# Patient Record
Sex: Male | Born: 1955 | Race: White | Hispanic: No | Marital: Married | Smoking: Never smoker
Health system: Southern US, Community
[De-identification: ages and names within clinical notes are randomized; demographics above are authoritative.]

## PROBLEM LIST (undated history)

## (undated) DIAGNOSIS — I472 Ventricular tachycardia: Secondary | ICD-10-CM

## (undated) DIAGNOSIS — E039 Hypothyroidism, unspecified: Secondary | ICD-10-CM

## (undated) DIAGNOSIS — I491 Atrial premature depolarization: Secondary | ICD-10-CM

## (undated) DIAGNOSIS — Z9289 Personal history of other medical treatment: Secondary | ICD-10-CM

## (undated) DIAGNOSIS — I471 Supraventricular tachycardia: Secondary | ICD-10-CM

## (undated) DIAGNOSIS — N419 Inflammatory disease of prostate, unspecified: Secondary | ICD-10-CM

## (undated) DIAGNOSIS — I498 Other specified cardiac arrhythmias: Secondary | ICD-10-CM

## (undated) DIAGNOSIS — I499 Cardiac arrhythmia, unspecified: Secondary | ICD-10-CM

## (undated) DIAGNOSIS — E785 Hyperlipidemia, unspecified: Secondary | ICD-10-CM

## (undated) DIAGNOSIS — I4729 Other ventricular tachycardia: Secondary | ICD-10-CM

## (undated) DIAGNOSIS — I493 Ventricular premature depolarization: Secondary | ICD-10-CM

## (undated) DIAGNOSIS — R9431 Abnormal electrocardiogram [ECG] [EKG]: Secondary | ICD-10-CM

## (undated) DIAGNOSIS — I4719 Other supraventricular tachycardia: Secondary | ICD-10-CM

## (undated) HISTORY — PX: APPENDECTOMY: SHX54

## (undated) HISTORY — DX: Other ventricular tachycardia: I47.29

## (undated) HISTORY — DX: Other specified cardiac arrhythmias: I49.8

## (undated) HISTORY — DX: Inflammatory disease of prostate, unspecified: N41.9

## (undated) HISTORY — DX: Cardiac arrhythmia, unspecified: I49.9

## (undated) HISTORY — DX: Personal history of other medical treatment: Z92.89

## (undated) HISTORY — DX: Supraventricular tachycardia: I47.1

## (undated) HISTORY — DX: Ventricular premature depolarization: I49.3

## (undated) HISTORY — DX: Ventricular tachycardia: I47.2

## (undated) HISTORY — DX: Other supraventricular tachycardia: I47.19

## (undated) HISTORY — DX: Abnormal electrocardiogram (ECG) (EKG): R94.31

## (undated) HISTORY — DX: Hypothyroidism, unspecified: E03.9

## (undated) HISTORY — DX: Hyperlipidemia, unspecified: E78.5

## (undated) HISTORY — DX: Atrial premature depolarization: I49.1

---

## 2004-02-01 ENCOUNTER — Ambulatory Visit: Payer: Self-pay | Admitting: Internal Medicine

## 2004-02-09 ENCOUNTER — Ambulatory Visit: Payer: Self-pay | Admitting: Internal Medicine

## 2004-02-27 HISTORY — PX: BASAL CELL CARCINOMA EXCISION: SHX1214

## 2005-02-12 ENCOUNTER — Ambulatory Visit: Payer: Self-pay | Admitting: Internal Medicine

## 2006-02-01 ENCOUNTER — Ambulatory Visit: Payer: Self-pay | Admitting: Internal Medicine

## 2006-02-01 LAB — CONVERTED CEMR LAB
ALT: 23 units/L (ref 0–40)
AST: 24 units/L (ref 0–37)
Alkaline Phosphatase: 55 units/L (ref 39–117)
BUN: 9 mg/dL (ref 6–23)
Basophils Relative: 1.2 % — ABNORMAL HIGH (ref 0.0–1.0)
Calcium: 9.5 mg/dL (ref 8.4–10.5)
Chol/HDL Ratio, serum: 3.8
Cholesterol: 216 mg/dL (ref 0–200)
Eosinophil percent: 7.5 % — ABNORMAL HIGH (ref 0.0–5.0)
GFR calc non Af Amer: 84 mL/min
Glomerular Filtration Rate, Af Am: 102 mL/min/{1.73_m2}
Glucose, Bld: 89 mg/dL (ref 70–99)
HDL: 57.2 mg/dL (ref >39.0)
Hgb A1c MFr Bld: 5.5 % (ref 4.6–6.0)
Monocytes Relative: 9.8 % (ref 3.0–11.0)
Platelets: 273 10*3/uL (ref 150–400)
Potassium: 4.3 meq/L (ref 3.5–5.1)
RDW: 12.2 % (ref 11.5–14.6)
TSH: 3.38 microintl units/mL (ref 0.35–5.50)
Total Protein: 7.2 g/dL (ref 6.0–8.3)
VLDL: 19 mg/dL (ref 0–40)
WBC: 3.3 10*3/uL — ABNORMAL LOW (ref 4.5–10.5)

## 2006-04-05 ENCOUNTER — Ambulatory Visit: Payer: Self-pay

## 2006-05-14 ENCOUNTER — Ambulatory Visit: Payer: Self-pay | Admitting: Internal Medicine

## 2006-05-14 LAB — CONVERTED CEMR LAB
ALT: 19 units/L (ref 0–40)
HDL: 53.3 mg/dL (ref >39.0)
Triglycerides: 71 mg/dL (ref 0–149)

## 2006-05-16 ENCOUNTER — Ambulatory Visit: Payer: Self-pay | Admitting: Internal Medicine

## 2006-06-04 ENCOUNTER — Ambulatory Visit: Payer: Self-pay

## 2006-06-13 ENCOUNTER — Ambulatory Visit: Payer: Self-pay | Admitting: Internal Medicine

## 2006-06-26 ENCOUNTER — Ambulatory Visit: Payer: Self-pay | Admitting: Cardiovascular Disease

## 2006-07-12 ENCOUNTER — Ambulatory Visit: Payer: Self-pay | Admitting: Internal Medicine

## 2006-07-16 ENCOUNTER — Encounter: Payer: Self-pay | Admitting: Cardiovascular Disease

## 2006-07-16 ENCOUNTER — Ambulatory Visit: Payer: Self-pay

## 2007-02-24 ENCOUNTER — Telehealth (INDEPENDENT_AMBULATORY_CARE_PROVIDER_SITE_OTHER): Payer: Self-pay | Admitting: *Deleted

## 2007-02-27 HISTORY — PX: COLONOSCOPY: SHX174

## 2007-03-27 ENCOUNTER — Telehealth: Payer: Self-pay | Admitting: Internal Medicine

## 2007-04-04 ENCOUNTER — Telehealth (INDEPENDENT_AMBULATORY_CARE_PROVIDER_SITE_OTHER): Payer: Self-pay | Admitting: *Deleted

## 2007-04-30 ENCOUNTER — Ambulatory Visit: Payer: Self-pay | Admitting: Internal Medicine

## 2007-05-19 ENCOUNTER — Ambulatory Visit: Payer: Self-pay | Admitting: Internal Medicine

## 2007-05-19 DIAGNOSIS — E785 Hyperlipidemia, unspecified: Secondary | ICD-10-CM | POA: Insufficient documentation

## 2007-05-19 DIAGNOSIS — R002 Palpitations: Secondary | ICD-10-CM | POA: Insufficient documentation

## 2007-05-21 LAB — CONVERTED CEMR LAB
ALT: 19 units/L (ref 0–53)
Albumin: 4.1 g/dL (ref 3.5–5.2)
Alkaline Phosphatase: 45 units/L (ref 39–117)
BUN: 15 mg/dL (ref 6–23)
Basophils Absolute: 0 10*3/uL (ref 0.0–0.1)
Basophils Relative: 1.5 % — ABNORMAL HIGH (ref 0.0–1.0)
CO2: 32 meq/L (ref 19–32)
Calcium: 9.2 mg/dL (ref 8.4–10.5)
Cholesterol: 165 mg/dL (ref 0–200)
GFR calc Af Amer: 115 mL/min
GFR calc non Af Amer: 95 mL/min
HDL: 66.3 mg/dL (ref >39.0)
Hemoglobin: 13 g/dL (ref 13.0–17.0)
LDL Cholesterol: 80 mg/dL (ref 0–99)
Lymphocytes Relative: 28 % (ref 12.0–46.0)
MCHC: 33.1 g/dL (ref 30.0–36.0)
Monocytes Absolute: 0.3 10*3/uL (ref 0.2–0.7)
Monocytes Relative: 10.3 % (ref 3.0–11.0)
Neutro Abs: 1.7 10*3/uL (ref 1.4–7.7)
PSA: 0.32 ng/mL (ref 0.10–4.00)
Platelets: 223 10*3/uL (ref 150–400)
Potassium: 4.2 meq/L (ref 3.5–5.1)
TSH: 3.67 microintl units/mL (ref 0.35–5.50)
Triglycerides: 92 mg/dL (ref 0–149)
VLDL: 18 mg/dL (ref 0–40)

## 2007-05-22 ENCOUNTER — Encounter (INDEPENDENT_AMBULATORY_CARE_PROVIDER_SITE_OTHER): Payer: Self-pay | Admitting: *Deleted

## 2007-06-04 ENCOUNTER — Ambulatory Visit: Payer: Self-pay | Admitting: Gastroenterology

## 2007-06-16 ENCOUNTER — Ambulatory Visit: Payer: Self-pay | Admitting: Gastroenterology

## 2007-06-16 ENCOUNTER — Encounter: Payer: Self-pay | Admitting: Internal Medicine

## 2007-06-20 ENCOUNTER — Ambulatory Visit: Payer: Self-pay | Admitting: Internal Medicine

## 2007-08-12 ENCOUNTER — Ambulatory Visit: Payer: Self-pay

## 2007-09-09 ENCOUNTER — Ambulatory Visit: Payer: Self-pay

## 2007-09-09 ENCOUNTER — Encounter: Payer: Self-pay | Admitting: Internal Medicine

## 2007-09-22 ENCOUNTER — Telehealth: Payer: Self-pay | Admitting: Internal Medicine

## 2007-10-13 ENCOUNTER — Ambulatory Visit: Payer: Self-pay | Admitting: Internal Medicine

## 2007-10-13 LAB — CONVERTED CEMR LAB
BUN: 17 mg/dL (ref 6–23)
Basophils Relative: 0.7 % (ref 0.0–3.0)
CO2: 30 meq/L (ref 19–32)
Chloride: 104 meq/L (ref 96–112)
Eosinophils Absolute: 0.1 10*3/uL (ref 0.0–0.7)
Eosinophils Relative: 2.3 % (ref 0.0–5.0)
GFR calc non Af Amer: 84 mL/min
INR: 1 (ref 0.8–1.0)
Lymphocytes Relative: 21.6 % (ref 12.0–46.0)
MCV: 98 fL (ref 78.0–100.0)
Neutrophils Relative %: 64.6 % (ref 43.0–77.0)
Platelets: 204 10*3/uL (ref 150–400)
Potassium: 4.4 meq/L (ref 3.5–5.1)
Prothrombin Time: 12.2 s (ref 10.9–13.3)
RBC: 3.89 M/uL — ABNORMAL LOW (ref 4.22–5.81)
WBC: 4.1 10*3/uL — ABNORMAL LOW (ref 4.5–10.5)
aPTT: 30.4 s — ABNORMAL HIGH (ref 21.7–29.8)

## 2007-10-20 ENCOUNTER — Ambulatory Visit: Payer: Self-pay | Admitting: Cardiology

## 2007-10-20 ENCOUNTER — Inpatient Hospital Stay (HOSPITAL_BASED_OUTPATIENT_CLINIC_OR_DEPARTMENT_OTHER): Admission: RE | Admit: 2007-10-20 | Discharge: 2007-10-20 | Payer: Self-pay | Admitting: Cardiology

## 2007-12-11 ENCOUNTER — Ambulatory Visit: Payer: Self-pay | Admitting: Internal Medicine

## 2008-02-27 DIAGNOSIS — I499 Cardiac arrhythmia, unspecified: Secondary | ICD-10-CM

## 2008-02-27 HISTORY — DX: Cardiac arrhythmia, unspecified: I49.9

## 2008-02-27 HISTORY — PX: CARDIAC CATHETERIZATION: SHX172

## 2008-03-10 ENCOUNTER — Ambulatory Visit: Payer: Self-pay | Admitting: Internal Medicine

## 2008-08-06 ENCOUNTER — Ambulatory Visit: Payer: Self-pay | Admitting: Internal Medicine

## 2008-08-06 DIAGNOSIS — Z85828 Personal history of other malignant neoplasm of skin: Secondary | ICD-10-CM | POA: Insufficient documentation

## 2008-08-06 LAB — CONVERTED CEMR LAB
HDL goal, serum: 40 mg/dL
LDL Goal: 115 mg/dL

## 2008-08-08 LAB — CONVERTED CEMR LAB
Albumin: 4.1 g/dL (ref 3.5–5.2)
BUN: 16 mg/dL (ref 6–23)
Basophils Absolute: 0 10*3/uL (ref 0.0–0.1)
CO2: 32 meq/L (ref 19–32)
Cholesterol: 176 mg/dL (ref 0–200)
Eosinophils Absolute: 0.3 10*3/uL (ref 0.0–0.7)
Glucose, Bld: 99 mg/dL (ref 70–99)
HCT: 39.8 % (ref 39.0–52.0)
HDL: 69.2 mg/dL (ref >39.00)
Hemoglobin: 13.8 g/dL (ref 13.0–17.0)
Lymphs Abs: 1 10*3/uL (ref 0.7–4.0)
MCHC: 34.7 g/dL (ref 30.0–36.0)
Monocytes Absolute: 0.3 10*3/uL (ref 0.1–1.0)
Neutro Abs: 2.8 10*3/uL (ref 1.4–7.7)
PSA: 0.79 ng/mL (ref 0.10–4.00)
Potassium: 4.3 meq/L (ref 3.5–5.1)
RDW: 12.2 % (ref 11.5–14.6)
Sodium: 140 meq/L (ref 135–145)
TSH: 3.88 microintl units/mL (ref 0.35–5.50)

## 2008-08-09 ENCOUNTER — Encounter (INDEPENDENT_AMBULATORY_CARE_PROVIDER_SITE_OTHER): Payer: Self-pay | Admitting: *Deleted

## 2009-06-08 ENCOUNTER — Ambulatory Visit: Payer: Self-pay | Admitting: Internal Medicine

## 2009-06-13 LAB — CONVERTED CEMR LAB
ALT: 23 units/L (ref 0–53)
AST: 29 units/L (ref 0–37)
Albumin: 4.3 g/dL (ref 3.5–5.2)
Alkaline Phosphatase: 58 units/L (ref 39–117)
Basophils Absolute: 0 10*3/uL (ref 0.0–0.1)
CO2: 32 meq/L (ref 19–32)
Calcium: 9.2 mg/dL (ref 8.4–10.5)
Cholesterol: 179 mg/dL (ref 0–200)
GFR calc non Af Amer: 94.04 mL/min (ref >60)
HCT: 40.1 % (ref 39.0–52.0)
Lymphs Abs: 1 10*3/uL (ref 0.7–4.0)
Monocytes Absolute: 0.3 10*3/uL (ref 0.1–1.0)
Monocytes Relative: 8.2 % (ref 3.0–12.0)
PSA: 0.36 ng/mL (ref 0.10–4.00)
Platelets: 235 10*3/uL (ref 150.0–400.0)
RDW: 13.1 % (ref 11.5–14.6)
Sodium: 142 meq/L (ref 135–145)
Total CHOL/HDL Ratio: 3
Total Protein: 7.3 g/dL (ref 6.0–8.3)
Triglycerides: 69 mg/dL (ref 0.0–149.0)

## 2009-06-14 ENCOUNTER — Ambulatory Visit: Payer: Self-pay | Admitting: Internal Medicine

## 2009-06-14 DIAGNOSIS — R946 Abnormal results of thyroid function studies: Secondary | ICD-10-CM | POA: Insufficient documentation

## 2009-08-22 ENCOUNTER — Ambulatory Visit: Payer: Self-pay | Admitting: Internal Medicine

## 2009-08-23 LAB — CONVERTED CEMR LAB: TSH: 4.55 microintl units/mL (ref 0.35–5.50)

## 2009-10-28 ENCOUNTER — Telehealth (INDEPENDENT_AMBULATORY_CARE_PROVIDER_SITE_OTHER): Payer: Self-pay | Admitting: *Deleted

## 2010-01-17 ENCOUNTER — Encounter: Payer: Self-pay | Admitting: Internal Medicine

## 2010-03-28 NOTE — Assessment & Plan Note (Signed)
Summary: CPX,LABS PRIOR,BCBS INS/RH.....   Vital Signs:  Patient profile:   55 year old male Height:      73 inches Weight:      182.2 pounds BMI:     24.13 Temp:     98.4 degrees F oral Pulse rate:   60 / minute Resp:     14 per minute BP sitting:   112 / 68  (left arm) Cuff size:   large  Vitals Entered By: Shonna Chock (June 14, 2009 9:45 AM)  Comments REVIEWED MED LIST, PATIENT AGREED DOSE AND INSTRUCTION CORRECT    History of Present Illness: Joshua [name] is here for a physical; he is asymptomatic except for occasional palpitations for which he is followed by Dr Graciela Husbands.Labs reviewed : TSH 5.90 ,suggesting hypothyroidism.  Allergies (verified): No Known Drug Allergies  Past History:  Past Medical History: HYPERLIPIDEMIA (ICD-272.4): NMR Lipoprofile : LDL 841(3244/010), HDL 57, TG 86. LDL goal = < 115. Paroxysmal ,non sustained Atrial  Tachycardia, Dr Berton Mount Skin cancer, hx of, basal cell  cancer R nasal area, Dr Para Skeans Prostatitis , PMH of  Past Surgical History: Appendectomy Colonoscopy 2009: negative (due 2019); Catheterization 11/2007 for hypotension with Stress Test : negative Tonsillectomy  Family History: Father: alcoholism,CVA,CAD,CABG,DM,depression Mother: arthritis, breast cancer, thyroid ablation? Siblings: bro: PAF , failed ablation X3  Social History: Never Smoked Alcohol use-yes: socially 3-4X/week Retired (pursuing business opportunities: franchise, Designer, television/film set) Married Regular exercise-yes:CVE 3X/week for 45 min or more  Review of Systems General:  Denies fatigue, sleep disorder, and weight loss. Eyes:  Denies blurring, double vision, and vision loss-both eyes. ENT:  Denies decreased hearing, difficulty swallowing, hoarseness, and ringing in ears. CV:  Denies chest pain or discomfort, difficulty breathing at night, difficulty breathing while lying down, fainting, fatigue, leg cramps with exertion, lightheadness, near fainting,  shortness of breath with exertion, swelling of feet, and swelling of hands. Resp:  Denies cough, shortness of breath, and sputum productive. GI:  Denies abdominal pain, bloody stools, change in bowel habits, dark tarry stools, and indigestion. GU:  Denies discharge, dysuria, and hematuria. MS:  Denies joint pain, joint redness, joint swelling, low back pain, mid back pain, muscle weakness, and thoracic pain. Derm:  Denies changes in nail beds, dryness, lesion(s), and rash. Neuro:  Denies headaches, numbness, tingling, and weakness. Psych:  Denies anxiety and depression. Endo:  Denies cold intolerance, excessive hunger, excessive thirst, excessive urination, and heat intolerance. Heme:  Denies abnormal bruising and bleeding. Allergy:  Denies itching eyes and sneezing.  Physical Exam  General:  Thin but well-nourished; alert,appropriate and cooperative throughout examination Head:  Normocephalic and atraumatic without obvious abnormalities. Pattern  alopecia ; moustache & goatee Eyes:  No corneal or conjunctival inflammation noted.Perrla. Funduscopic exam benign, without hemorrhages, exudates or papilledema.Tigroid fundal changes Ears:  Wax R canal; oteomata L anterior canal Nose:  External nasal examination shows no deformity or inflammation. Nasal mucosa are pink and moist without lesions or exudates. Mouth:  Oral mucosa and oropharynx without lesions or exudates.  Teeth in good repair. Neck:  No deformities, masses, or tenderness noted. Lungs:  Normal respiratory effort, chest expands symmetrically. Lungs are clear to auscultation, no crackles or wheezes. Heart:  Normal rate and regular rhythm. S1 and S2 normal without gallop, murmur, click, rub .S4 Abdomen:  Bowel sounds positive,abdomen soft and non-tender without masses, organomegaly or hernias noted. Rectal:  No external abnormalities noted. Normal sphincter tone. No rectal masses or tenderness. Genitalia:  Testes bilaterally descended  without nodularity, tenderness or masses. No scrotal masses or lesions. No penis lesions or urethral discharge. Prostate:  Prostate gland firm and smooth, no enlargement, nodularity, tenderness, mass, asymmetry or induration. Msk:  No deformity or scoliosis noted of thoracic or lumbar spine.   Pulses:  R and L carotid,radial,dorsalis pedis and posterior tibial pulses are full and equal bilaterally Extremities:  No clubbing, cyanosis, edema, or deformity noted with normal full range of motion of all joints.   Neurologic:  alert & oriented X3 and DTRs symmetrical and normal.   Skin:  Intact without suspicious lesions or rashes Cervical Nodes:  No lymphadenopathy noted Axillary Nodes:  No palpable lymphadenopathy Inguinal Nodes:  No significant adenopathy Psych:  memory intact for recent and remote, normally interactive, and good eye contact.     Impression & Recommendations:  Problem # 1:  ROUTINE GENERAL MEDICAL EXAM@HEALTH  CARE FACL (ICD-V70.0)  Orders: EKG w/ Interpretation (93000)  Problem # 2:  HYPERLIPIDEMIA (ICD-272.4)  His updated medication list for this problem includes:    Pravastatin Sodium 40 Mg Tabs (Pravastatin sodium) .Marland Kitchen... 1 by mouth once daily  Problem # 3:  THYROID FUNCTION TEST, ABNORMAL (ICD-794.5) TSH 5.90  Problem # 4:  PALPITATIONS (ICD-785.1)  Non sustained Atrial Tachycardia by PMH (Dr Graciela Husbands)  Orders: EKG w/ Interpretation (93000)  Complete Medication List: 1)  Lorazepam 1 Mg Tabs (Lorazepam) .... 1/2 tab as needed 2)  Pravastatin Sodium 40 Mg Tabs (Pravastatin sodium) .Marland Kitchen.. 1 by mouth once daily 3)  St. John's Wort   Patient Instructions: 1)  Please schedule a follow-up  Lab appointment in 2 months. 2)  TSH ; free T4,free T3: 794.5. Prescriptions: PRAVASTATIN SODIUM 40 MG  TABS (PRAVASTATIN SODIUM) 1 by mouth once daily  #90 x 3   Entered and Authorized by:   Marga Melnick MD   Signed by:   Marga Melnick MD on 06/14/2009   Method used:   Print  then Give to Patient   RxID:   [id number]

## 2010-03-28 NOTE — Letter (Signed)
Summary: [facility]   Imported By: Lanelle Bal 01/27/2010 09:49:52  _____________________________________________________________________  External Attachment:    Type:   Image     Comment:   External Document

## 2010-03-28 NOTE — Progress Notes (Signed)
Summary: refill  Phone Note Refill Request Message from:  Fax from Pharmacy on October 28, 2009 9:08 AM  Refills Requested: Medication #1:  LORAZEPAM 1 MG  TABS 1/2 tab [facility] [address] [facility] fax [phone]  Initial call taken by: Okey Regal [place],  October 28, 2009 9:09 AM    Prescriptions: LORAZEPAM 1 MG  TABS (LORAZEPAM) 1/2 tab as needed  #30 x 1   Entered by:   Shonna Chock CMA   Authorized by:   Marga Melnick MD   Signed by:   Shonna Chock CMA on 10/28/2009   Method used:   Printed then faxed to ...       [facility]  [phone]* (retail)       [address] [place], [place]  [address]       Ph: [phone] or [phone]       Fax: [phone]   RxID:   [id number]

## 2010-07-11 NOTE — H&P (Signed)
Joshua [name], Joshua [name]              ACCOUNT NO.:  [id number]   MEDICAL RECORD NO.:  [id number]          PATIENT TYPE:  OIB   LOCATION:  1961                         FACILITY:  [facility]   PHYSICIAN:  Arturo Morton. Riley Kill, MD, FACCDATE OF BIRTH:  1955/12/30   DATE OF ADMISSION:  10/20/2007  DATE OF DISCHARGE:  10/20/2007                              HISTORY & PHYSICAL   CHIEF COMPLAINT:  Abnormal stress test.   HISTORY OF PRESENT ILLNESS:  Joshua [name] is a 55 year old gentleman seen  by Dr. Graciela Husbands and set up for outpatient cardiac catheterization.  The  patient has not had exertional chest discomfort.  He has had  palpitation, has been followed by Dr. Graciela Husbands and Dr. Eden Emms.  He had a 2-  D echocardiogram that was unremarkable.  His brother has had atrial  fibrillation on several occasions, and apparently had an ablation.  He  underwent an exercise treadmill study to see if there could be exercise  and postexercise associated tachy palpitations which is what he feels.  He did have an exercise treadmill that was terminated in stage V.  At  peak stress, the patient had a drop in blood pressure and then  afterwards dropped again.  As a result, they ended up doing a Myoview.  On the Myoview, he had scattered PVCs and a one 3-beat of VT.  The  ejection fraction was 54%.  There was normal contractility and no  definite perfusion defect.  His echocardiogram has been previously  normal.  Nonetheless, after discussion with Dr. Graciela Husbands, it was felt that  this would be best sorted out with cardiac catheterization.  As a  result, he is set up for this.  Risks, benefits, and alternatives have  been discussed with the patient.  He was agreeable to proceed.   Past medical history is remarkable for appendectomy and elevated  cholesterol.   Medications include pravastatin and St. John's wort.   ALLERGIES:  None.   FAMILY HISTORY:  Father died at 49 during coronary bypass graft surgery,  which the patient  feels that he probably had a stroke.  His mother has  arthritis.  He has a brother who has had recurrent atrial fibrillation  and three ablation procedures.  He has sisters who have mainly  arthritis, but no coronary artery disease.   SOCIAL HISTORY:  The patient is a nonsmoker who remains active and works  out.  He is most recently into yoga.  He has three children and is  married.   REVIEW OF SYSTEMS:  The patient has not had exertional chest pain,  syncope, presyncope, diaphoresis, or other major symptoms.   On examination, he is alert and oriented, in no distress.  Blood  pressure was 103/73, pulse 50-55, saturation 99%, and respirations 18.  The jugular veins are not distended.  The carotid upstrokes are brisk  without bruits.  HEENT examination briefly is unremarkable.  The lungs  are clear to auscultation and percussion.  The cardiac rhythm is regular  with normal first and second heart sound and no obvious murmurs on exam.  His abdomen is soft.  I do not appreciate masses.  Pulses were intact.  Extremities are without edema.   The electrocardiogram is normal.   The echocardiogram and nuclear study are previously described in  previous reports.   IMPRESSION:  1. Mildly abnormal exercise stress test with history of tachy      palpitations.  2. Hypercholesterolemia on lipid-lowering therapy.   PLAN:  The risks, benefits, and alternatives have been discussed with  the patient in detail, and he has consented to proceed.  Cardiac  catheterization has been recommended.      Arturo Morton. Riley Kill, MD, [facility]  Electronically Signed     TDS/MEDQ  D:  10/20/2007  T:  10/20/2007  Job:  [id number]

## 2010-07-11 NOTE — Procedures (Signed)
[facility]                              EXERCISE TREADMILL   NAME:Joshua [name], Joshua [name]                     MRN:          [id number]  DATE:08/12/2007                            DOB:          02-01-1956    HISTORY OF PRESENT ILLNESS:  [name] was sent and admitted for a  treadmill testing to see if we could provoke an exercise and post-  exercise associated tachy palpitations.  He underwent a modification of  his standard Bruce protocol, which was terminated because of fatigue a  minute and a half or so into stage V.  It was also notable for a fall in  blood pressure peak exercise, normal blood pressure responses noted in  the first couple of stages, it then plateaued and then began to fall,  and post exercise blood pressures were low and then rebounded.   I have discussed with him the potential prognostic implications of this,  noting that his rhythm was normal throughout.  We will plan to review  his treadmill from a year ago at the time of his Myoview and then make a  decision as to whether we will repeat a treadmill test with Myoview  scanning or undertake catheterization for exercise-induced hypotension.     Duke Salvia, MD, [facility]  Electronically Signed    SCK/MedQ  DD: 08/12/2007  DT: 08/13/2007  Job #: [id number]

## 2010-07-11 NOTE — Letter (Signed)
Jul 12, 2006    Titus Dubin. Alwyn Ren, MD,FACP,FCCP  [id number] [place]  [place] [address]   RE:  Joshua [name], Joshua [name]  MRN:  [id number]  /  DOB:  10-Nov-1955   Dear Hop:   I had the privilege of seeing Jigar Zielke today in somewhat  abbreviated form because I was running late and he had a meeting to get  to.  I apologize to him and to you.  I would like to blame it in on the  fact that I was an hour late getting to the office after I flew in from  [place] this morning.   In any case, Mr. 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  He was told somewhere along the line that these were  PVCs.   For the last 6 months he has had a great deal of extrinsic stress.  About 4 or 5 months he noticed a new type of palpitations; these were  similar to what he was feeling before, but lasting 5-10 minutes.  There  was no associated lightheadedness, chest pain, or shortness of breath.  Over the last number of months he has had a somewhat different syndrome  with much faster irregular palpitations lasting 30-40 seconds; again,  these were unassociated with any significant symptoms.   He discontinued caffeine and he feeling some better. He discontinued his  decaf coffee and he is feeling still better.   His cardiac evaluation to date has included a Myoview which was  essentially normal.  Event loop recorder with autotriggers was utilized.  There is episodes of nonsustained atrial tachycardia, 4 or 5 beats on a  couple of occasions.  There was an isolated PVC, and isolated PACs.  None of the episodes were his concerning spells.   PAST MEDICAL HISTORY:  Notable for some prostatitis.   PAST SURGICAL HISTORY:  Notable for appendectomy.   FAMILY HISTORY:  Not contributory.   He is married, he has 3 kids.   MEDICATIONS:  1. Pravastatin 40.  2. Saw palmetto.  3. St. John's  wart.   HE HAS NO KNOWN DRUG ALLERGIES.   He does not smoke, he uses alcohol and exercises.   EXAMINATION:  His blood pressure was 108/74, his pulse was 56.  LUNGS:  Clear.  HEART:  Sounds were regular.  EXTREMITIES:  Without edema.   Electrocardiogram today demonstrated sinus rhythm at 56 with intervals  of 0.18/0.08/0.40, the axis was 77 degrees, there was no isolated PAC.   IMPRESSION:  1. Palpitations with demonstrated PACs, nonsustained atrial      tachycardia, and PVCs.  2. Family history of a brother with atrial fibrillation who has      undergone 3 failed atrial fibrillation ablations.  3. Thromboembolic risk factors are broadly negative.   Hop, I think Mr. Hamberger nonsustained atrial tachycardia is likely the  culprit here.  I wonder whether the longer episodes have represented  nonsustained atrial fibrillation.  I mentioned this to him, this is  obviously a concern given his brother's history.   Furthermore, I suggest that we undertake an echo to look at the  structure of his atria and the relative size to see if we can get some  predictive notion of the likelihood of recurrence, and the  probability of atrial fibrillation.  This is scheduled and I will plan  to review  these results with him.  I have also suggested that we get  together in about 3 month's time and we will arrange that.   Thank you very much for the consultation.    Sincerely,      Duke Salvia, MD, [facility]  Electronically Signed    SCK/MedQ  DD: 07/12/2006  DT: 07/12/2006  Job #: [id number]

## 2010-07-11 NOTE — Assessment & Plan Note (Signed)
[facility]                         ELECTROPHYSIOLOGY OFFICE NOTE   NAME:[name], Joshua BRUMBACH                     MRN:          [id number]  DATE:04/30/2007                            DOB:          Jul 27, 1955    HISTORY:  Joshua [name] comes in with a new palpitation syndrome.  He has  been working out vigorously since January 1 when he stepped down from  his last job.  While he is going through spinning classes he notices at  peak exertion there is a new rapid and relatively sustained tachy  palpitation.  It is concerning to him.  As noted in the previous notes  he has had a multitude of documented arrhythmias and his brother has had  atrial fibrillation ablation on a couple of occasions.   CURRENT MEDICATIONS:  1. His current medications include pravastatin.  2. Saw palmetto.  3. St. John's wort.   PHYSICAL EXAMINATION:  VITAL SIGNS:  On examination his blood pressure  is 120/71, his pulse is 60 and his weight was 180.  LUNGS:  Lungs were clear.  HEART:  Sounds were regular.  EXTREMITIES:  Without edema.   Electrocardiogram dated today demonstrated sinus rhythm at 62 with  intervals of 0.18/0.08/0.40 and the axis was 85 degrees.   IMPRESSION:  1. Tachy palpitations.  2. Documented PACs (premature atrial contractions), PVCs (premature      ventricular contractions) and nonsustained atrial tachycardia.   I suspect that Joshua [name] tachy palpitations at peak exercise likely  represent atrial fibrillation with a rapid ventricular response.  The  rate of that response was important in also clarifying that it is not  ventricular given his ventricular ectopy history is also going to be  important.   We will plan to use a CardioNet monitor to elucidate this and I will see  him again in 4 weeks' time or so.     Duke Salvia, MD, [facility]  Electronically Signed    SCK/MedQ  DD: 04/30/2007  DT: 04/30/2007  Job #: [id number]   cc:   Joshua [name]. Joshua Ren,  MD,FACP,FCCP

## 2010-07-11 NOTE — Assessment & Plan Note (Signed)
[facility]                         ELECTROPHYSIOLOGY OFFICE NOTE   NAME:Joshua [name], Joshua [name]                     MRN:          [id number]  DATE:03/10/2008                            DOB:          1956-01-22    [name] is seen in followup for exercise-induced wide-complex  rhythms and I have not been quite sure whether they are ventricular or  supraventricular in origin.  He does have PACs and PVCs.  These have  been relatively stable.  They continue to occur during the day as well  as with exertion.  He has had no other significant associated symptoms.   He is taking no medications except for pravastatin.   On examination, his blood pressure was 96/60, his pulse was 56 and  regular, his weight was 183, which was stable.  His lungs were clear.  Heart sounds were regular.  The extremities were without edema.   IMPRESSION:  1. Mildly symptomatic premature atrial contractions and premature      ventricular contractions and nonsustained ventricular tachycardia.  2. Normal coronary arteries following hypotension during stress      testing.   [name] is stable.  We plan to see him again in a year.  He is to  call us if he has any problems in the interim.     Duke Salvia, MD, [facility]  Electronically Signed    SCK/MedQ  DD: 03/10/2008  DT: 03/11/2008  Job #: [id number]

## 2010-07-11 NOTE — Assessment & Plan Note (Signed)
[facility]                         ELECTROPHYSIOLOGY OFFICE NOTE   NAME:Svec, SRIMAN TALLY                     MRN:          [id number]  DATE:06/20/2007                            DOB:          11-Oct-1955    Mr. Creasy comes in in followup for palpitations primarily associated  with exertion.  He used a Building services engineer and he said that the most  striking episode was on the second day, which was associated with a  recording demonstrating a wide complex irregular rhythm that I thought  was probably nonsustained ventricular tachycardia.  He also had  exercised.  Had a couple of episodes where he would have an atrial run  with atrial heart rates in the 230 range or so.  His biggest concern is  making sure that there is nothing seriously wrong that is being  precipitated by exercise and during his last exercise test felt that he  was not pushed sufficiently or extremely.   Currently his only medication includes pravastatin.   On examination today his blood pressure was 113/60 with a pulse of 65.  His lungs were clear.  His heart sounds regular.  The extremities were without edema.  He was in no acute distress.   We reviewed the recordings extensively as described above.   We will plan to bring him back for treadmill testing with the intention  of submitting him to very high exercise loads to see if we can  precipitate any of his symptomatic palpitations.     Duke Salvia, MD, [facility]  Electronically Signed    SCK/MedQ  DD: 06/20/2007  DT: 06/20/2007  Job #: [id number]

## 2010-07-11 NOTE — Cardiovascular Report (Signed)
Joshua [name], Joshua [name]              ACCOUNT NO.:  [id number]   MEDICAL RECORD NO.:  [id number]          PATIENT TYPE:  OIB   LOCATION:  1961                         FACILITY:  [facility]   PHYSICIAN:  Arturo Morton. Riley Kill, MD, FACCDATE OF BIRTH:  [birth year]   DATE OF PROCEDURE:  10/20/2007  DATE OF DISCHARGE:                            CARDIAC CATHETERIZATION   INDICATIONS:  Joshua [name] is a very pleasant 55 year old gentleman who  has had palpitations.  He has had exercise tolerance testing by Dr.  Graciela Husbands, and he had a blood pressure drop.  He also had 3 beats of  ventricular tachycardia.  His father had a bypass operation at age 51.  After discussion with Dr. Graciela Husbands, he recommended cardiac catheterization  for further evaluation.   PROCEDURES:  1. Left heart catheterization.  2. Selective coronary arteriography.  3. Selective left ventriculography.   DESCRIPTION OF PROCEDURE:  The patient was brought to the cath lab and  prepped and draped in the usual fashion after informed consent.  Through  an anterior puncture, right femoral artery was easily entered and a 4-  [place] sheath was placed.  Views of the left and right coronary arteries  were obtained.  Central aortic and left ventricular pressures were  measured with pigtail.  Ventriculography was performed in the RAO  projection.  There were no complications, and he was taken to the  holding area in satisfactory clinical condition.  He did receive 1 mg of  intravenous Versed for sedation.   HEMODYNAMIC DATA:  1. Central aortic pressure 118/76, mean 93.  2. Left ventricular pressure 113/15.  3. There is no gradient or pullback across aortic valve.   ANGIOGRAPHIC DATA:  1. The left main is short and is comprised of 2 separate ostia.  2. The left anterior descending artery courses to the apex.  It really      is smooth throughout without any significant and obvious focal      narrowing.  There is no significant coronary calcification.   There      are 2 smaller diagonal branches, and neither appears to have any      significant focal obstruction.  The distal LAD wraps the apical tip      and supplies the distal portion of the inferior wall.  There is      also a small third diagonal branch, which is free of disease.  3. The circumflex has a separate ostia with a small first marginal      branch.  The circumflex itself appears to be smooth and provides a      fairly large marginal branch, which appears to be free of critical      disease.  4. The right coronary artery is a moderate-sized vessel providing a      posterior descending and posterolateral system.  The RCA is smooth      throughout without any obvious calcification.  Both large branches      appear to be free of critical disease.  5. Ventriculography was done in the RAO projection.  There was some  ventricular ectopy.  Overall, LV systolic function appeared to be      preserved without any definite wall motion abnormalities.   CONCLUSIONS:  1. Well-preserved left ventricular function.  2. No evidence of coronary calcification.  3. Smooth coronary vessels without significant focal obstructive      disease.   DISPOSITION:  The patient will continue to follow up with Dr. Graciela Husbands to  determine whether any further evaluation is necessary.  Continuation on  cholesterol-lowering drugs will be at the discretion of Dr. Alwyn Ren.      Arturo Morton. Riley Kill, MD, [facility]  Electronically Signed     TDS/MEDQ  D:  10/20/2007  T:  10/20/2007  Job:  [id number]   cc:   Titus Dubin. Alwyn Ren, MD,FACP,FCCP  Duke Salvia, MD, [facility]

## 2010-07-11 NOTE — Assessment & Plan Note (Signed)
[facility]                         ELECTROPHYSIOLOGY OFFICE NOTE   NAME:Joshua [name], Joshua [name]                     MRN:          [id number]  DATE:12/11/2007                            DOB:          [birth year]    Mr. Chmiel is seen in followup for palpitations associated mostly with  exertion.  He is now working with a Insurance claims handler and continues to  have palpitations.  When we undertook his event recorder, we noted a  number of things, primarily atrial tachycardia runs, there were some  wide complexes in the middle of this.  I suspect that while I thought  there was VT, it probably represents atrial tachycardia with aberration,  but I cannot be sure.  Because of that, he underwent Myoview scanning,  which was associated with hypotension and underwent catheterization,  which was normal, and he is doing really quite well at this time.   The only other thing that I felt we might do is to do an MRI scan if  necessary to try to look to make sure there is no structural issue in  terms of the myocardium itself.   PHYSICAL EXAMINATION:  VITAL SIGNS:  His blood pressure is 106/68 and  his pulse is 54.  LUNGS:  Clear.  HEART:  Regular.  EXTREMITIES:  Without edema.   We will see him again in 3-4 months' time.     Duke Salvia, MD, [facility]  Electronically Signed    SCK/MedQ  DD: 12/11/2007  DT: 12/12/2007  Job #: [id number]   cc:   Titus Dubin. Alwyn Ren, MD,FACP,FCCP

## 2010-07-14 NOTE — Assessment & Plan Note (Signed)
[facility]                            CARDIOLOGY OFFICE NOTE   NAME:Joshua [name], Joshua [name]                     MRN:          [id number]  DATE:06/26/2006                            DOB:          [birth year]    Joshua [name] is a pleasant 55 year old patient of Dr. Alwyn Ren.  He is  referred for palpitations.  He actually had an event monitor, which  showed PACs, PVCs, and occasional short bursts of PSVTs.  These were  reviewed by Dr. Ladona Ridgel.   The patient has had lifelong palpitations for over 20 years.  He has had  previous workup including an echo and stress test.  He has never been  found to have organic heart disease.   The patient has 2 different types of palpitations, 1 is occasional flip  flops, which are likely to produce PACs and PVCs, but he does  occasionally have longer runs that last a minute or so, which sound like  his SVT.   In general though, the patient has not been incapacitated by these.  They have never caused chest pain.  They have never caused.  They have  never caused syncope.   The patient clearly identifies stress as contributing to his symptoms.   His stress is multifocal.  He works in the [employer], which has  been very rough lately.  He also has 3 children, and he does tend to  drink excess wine and beer on the weekends.   In talking to the patient, he is still interested in seeing Dr. Ladona Ridgel,  one of our electrical doctors, and I explained to him that I suspect he  was put on my schedule because Dr. Lubertha Basque was backed up.  I told him I  would be happy for him to see an electrical doctor, although at the  current time, I do not think that any mechanical intervention is needed.   The patient's review of systems is otherwise remarkable for occasional  headaches.   He has never had heart catheterization.  There has been no evidence of  previous long QT syndrome or atrial fibrillation.  Interestingly, his  brother has  had problems atrial fibrillation.  He apparently had gone to  [place] to see some expert on atrial fibrillation ablation, and has had  complications, including a problem with his diaphragm.   The patient's only other surgery has been an appendectomy.  He has some  fatigue and prostatitis.   His mother is still alive.  His father had a stroke during an operation,  and died at the age of 31.   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   He is happily married with 3 children, 1 is about to graduate from  [employer].   His only medications currently include:  1. Pravastatin 40 a day.  2. Sal palmetto.  3. St. John's Wort.   THE PATIENT HAS NO ALLERGIES.   He does not smoke.  He admits to drinking fairly excessively on the  weekends, but says he does not drink  much during the week.  He tries to  do 30 minutes of cardio 3 to 4 times a week, and weights 1 to 2 times  per week.   EXAMINATION:  He is thin.  He seems in good shape.  His blood pressure is 120/70.  Pulse is 70 and regular. RR 12 Afebrile  HEENT:  Normal.  Carotids normal without bruit.  There is no thyromegaly.  LUNGS:  Clear. No accesory muscle use  There is S1 and S2 with normal heart sounds. PMI palpable and normal  ABDOMEN:  Benign. No masses, AAA, HSM or HJR  LOWER EXTREMITIES:  Intact pulses.  No edema.  NEUROLOGIC:  Nonfocal.  SKIN:  Warm and dry.  No muscular weakness   The patient had a stress Myoview on June 04, 2006.  It was totally  normal.  He exercised for 12 minutes to a maximum heart rate of 154.  There were no significant arrhythmias.  He has no prolonged QT interval,  and no prolonged PR interval.   IMPRESSION:  The patient would appear to have benign premature atrial  contractions and premature ventricular contractions exacerbated by the  stress at work, and possible alcohol.   I think it is reasonable to do a 2D echocardiogram to make sure there is   no other structural heart disease.   I suspect this will be normal since he has a totally normal EKG, and no  signs or symptoms of cardiomegaly.   I will make an appointment for him to see Dr. Ladona Ridgel, which the patient  requested initially.   I gave him a short acting prescription for Inderal 10 mg to take in case  he has any prolonged episodes of apparent PSVT.   I do not think he is currently a candidate for any type of ablation.   All of the patient's questions were answered, including the differences  between his current symptoms and his brother's atrial fibrillation.   I told the patient there should be no limitations on his activity.   Unless his echocardiogram is abnormal, he will see Dr. Ladona Ridgel next, and  follow up with me on a p.r.n. basis.     Noralyn Pick. Eden Emms, MD, [facility]  Electronically Signed    PCN/MedQ  DD: 06/26/2006  DT: 06/26/2006  Job #: [id number]   cc:   Titus Dubin. Alwyn Ren, MD,FACP,FCCP

## 2010-07-14 NOTE — Letter (Signed)
June 17, 2006    Deliah Boston   RE:  [name], Joshua  MRN:  [id number]  /  DOB:  [birth year]   Dear Joshua [name]:   I have reviewed the heart monitor which you employed from February 8 to  March 9 of this year.  With exercise you were able to raise your heart  rate to almost 150 without any significant rhythm abnormalities.  For  the most part your heart rhythm was sinus bradycardia ,a normal slow  rhythm.  You did have runs of extra beats from the top of the heart  (PAC,premature atrial contractions) and a rare beat from the lower  chambers (PVC,premature ventricular contraction)).  The tracings suggest  you may be having paroxysmal supraventricular tachycardia, a rapid  rhythm from the top chambers of the heart which is not predictable  (paroxysmal).  Dr. Ladona Ridgel is the Cardiologist who reviewed the tracings;  he is a rhythm specialist.  I would recommend that consultation be  scheduled with Dr. Ladona Ridgel to assess long term risk and need for  intervention.   Respectfully,    Sincerely,      Titus Dubin. Alwyn Ren, MD,FACP,FCCP  Electronically Signed    [facility]/MedQ  DD: 06/17/2006  DT: 06/17/2006  Job #: [id number]   CC:    Doylene Canning. Ladona Ridgel, MD

## 2010-07-21 ENCOUNTER — Other Ambulatory Visit: Payer: Self-pay | Admitting: *Deleted

## 2010-07-21 MED ORDER — PRAVASTATIN SODIUM 40 MG PO TABS: 40.0000 mg | ORAL_TABLET | Freq: Every day | ORAL | Status: DC

## 2010-07-25 ENCOUNTER — Other Ambulatory Visit: Payer: Self-pay | Admitting: Internal Medicine

## 2010-07-25 NOTE — Telephone Encounter (Signed)
DUPLICATE DUPLICATE 

## 2010-07-31 ENCOUNTER — Other Ambulatory Visit: Payer: Self-pay | Admitting: Internal Medicine

## 2010-07-31 MED ORDER — PRAVASTATIN SODIUM 40 MG PO TABS: 40.0000 mg | ORAL_TABLET | Freq: Every day | ORAL | Status: DC

## 2010-07-31 MED ORDER — LORAZEPAM 1 MG PO TABS: ORAL_TABLET | ORAL | Status: DC

## 2010-07-31 NOTE — Telephone Encounter (Signed)
Due for Lipid/Hep 272.4/995.20 or can schedule CPX and full Lab panel to be done : Lipid/Hep/BMP/CBCD/TSH/PSA/Stool Cards/Udip V70.0/272.4/995.20/794.5

## 2010-08-18 ENCOUNTER — Ambulatory Visit: Payer: Self-pay | Admitting: Internal Medicine

## 2010-09-11 ENCOUNTER — Other Ambulatory Visit: Payer: Self-pay | Admitting: Internal Medicine

## 2010-09-12 NOTE — Telephone Encounter (Signed)
Pending appointment for 10/2010

## 2010-11-21 ENCOUNTER — Encounter: Payer: Self-pay | Admitting: Internal Medicine

## 2010-11-21 ENCOUNTER — Ambulatory Visit (INDEPENDENT_AMBULATORY_CARE_PROVIDER_SITE_OTHER): Payer: BC Managed Care – PPO | Admitting: Internal Medicine

## 2010-11-21 VITALS — BP 112/78 | HR 52 | Temp 98.2°F | Resp 14 | Ht 73.25 in | Wt 176.8 lb

## 2010-11-21 DIAGNOSIS — R946 Abnormal results of thyroid function studies: Secondary | ICD-10-CM

## 2010-11-21 DIAGNOSIS — Z85828 Personal history of other malignant neoplasm of skin: Secondary | ICD-10-CM

## 2010-11-21 DIAGNOSIS — E785 Hyperlipidemia, unspecified: Secondary | ICD-10-CM

## 2010-11-21 DIAGNOSIS — Z0000 Encounter for general adult medical examination without abnormal findings: Secondary | ICD-10-CM

## 2010-11-21 LAB — LIPID PANEL
Cholesterol: 187 mg/dL (ref 0–200)
HDL: 72.7 mg/dL (ref >39.00)
Triglycerides: 62 mg/dL (ref 0.0–149.0)

## 2010-11-21 LAB — TSH: TSH: 2.85 u[IU]/mL (ref 0.35–5.50)

## 2010-11-21 LAB — HEPATIC FUNCTION PANEL
AST: 24 U/L (ref 0–37)
Albumin: 4.6 g/dL (ref 3.5–5.2)
Alkaline Phosphatase: 61 U/L (ref 39–117)
Total Protein: 7.9 g/dL (ref 6.0–8.3)

## 2010-11-21 LAB — BASIC METABOLIC PANEL
CO2: 31 mEq/L (ref 19–32)
Calcium: 9.7 mg/dL (ref 8.4–10.5)
Creatinine, Ser: 0.9 mg/dL (ref 0.4–1.5)
Glucose, Bld: 88 mg/dL (ref 70–99)

## 2010-11-21 LAB — CBC WITH DIFFERENTIAL/PLATELET
Basophils Absolute: 0 10*3/uL (ref 0.0–0.1)
Eosinophils Absolute: 0.2 10*3/uL (ref 0.0–0.7)
Lymphocytes Relative: 22.7 % (ref 12.0–46.0)
MCHC: 33.1 g/dL (ref 30.0–36.0)
Neutrophils Relative %: 65.5 % (ref 43.0–77.0)
RDW: 12.6 % (ref 11.5–14.6)

## 2010-11-21 MED ORDER — LORAZEPAM 1 MG PO TABS: ORAL_TABLET | ORAL | Status: DC

## 2010-11-21 MED ORDER — PRAVASTATIN SODIUM 40 MG PO TABS: 40.0000 mg | ORAL_TABLET | Freq: Every day | ORAL | Status: DC

## 2010-11-21 NOTE — Patient Instructions (Signed)
Preventive Health Care: Exercise at least 30-45 minutes a day,  3-4 days a week.  Eat a low-fat diet with lots of fruits and vegetables, up to 7-9 servings per day. Consume less than 40 grams of sugar per day from foods & drinks with High Fructose Corn Sugar as # 1,2,3 or # 4 on label.  

## 2010-11-21 NOTE — Progress Notes (Signed)
Subjective:    Patient ID: Joshua [name], male    DOB: August 30, 1955, 55 y.o.   MRN: [id number]  HPI  Joshua [name] is here for a physical;acute issues include non exertional  palpitations daily      Review of Systems Patient reports no  vision/ hearing changes,anorexia, weight change, fever ,adenopathy, persistant / recurrent hoarseness, swallowing issues, chest pain, edema,persistant / recurrent cough, hemoptysis, dyspnea(rest, exertional, paroxysmal nocturnal), gastrointestinal  bleeding (melena, rectal bleeding), abdominal pain, excessive heart burn, GU symptoms( dysuria, hematuria, pyuria, voiding/incontinence  issues) syncope, focal weakness, memory loss,numbness & tingling, skin/hair/nail changes,depression, abnormal bruising/bleeding,or musculoskeletal symptoms/signs. Lorazepam used as needed when flying.     Objective:   Physical Exam Gen.: Thin but healthy and well-nourished in appearance. Alert, appropriate and cooperative throughout exam. Head: Normocephalic without obvious abnormalities;  pattern alopecia . Goatee. Eyes: No corneal or conjunctival inflammation noted. Pupils equal round reactive to light and accommodation. Fundal exam is benign without hemorrhages, exudate, papilledema. Extraocular motion intact. Vision grossly normal. Ears: External  ear exam reveals no significant lesions or deformities. Canals : wax R > L. Hearing is grossly normal bilaterally. Nose: External nasal exam reveals no deformity or inflammation. Nasal mucosa are pink and moist. No lesions or exudates noted.   Mouth: Oral mucosa and oropharynx reveal no lesions or exudates. Teeth in good repair. Neck: No deformities, masses, or tenderness noted. Range of motion slightly decreased. Thyroid normal. Lungs: Normal respiratory effort; chest expands symmetrically. Lungs are clear to auscultation without rales, wheezes, or increased work of breathing. Heart: Normal rate and rhythm. Normal S1 and S2. No gallop,  click, or rub. S4 w/o  murmur. Abdomen: Bowel sounds normal; abdomen soft and nontender. No masses, organomegaly or hernias noted. Genitalia/ DRE: completely normal   .                                                                                   Musculoskeletal/extremities: No deformity or scoliosis noted of  the thoracic or lumbar spine. No clubbing, cyanosis, edema, or deformity noted. Range of motion  normal .Tone & strength  normal.Joints normal. Nail health  good. Vascular: Carotid, radial artery, dorsalis pedis and  posterior tibial pulses are full and equal. No bruits present. Neurologic: Alert and oriented x3. Deep tendon reflexes symmetrical and normal.          Skin: Intact without suspicious lesions or rashes. Lymph: No cervical, axillary, or inguinal lymphadenopathy present. Psych: Mood and affect are normal. Normally interactive                                                                                        Assessment & Plan:  #1 comprehensive physical exam; no acute findings #2 see Problem List with Assessments & Recommendations Plan: see Orders   Note: EKG was interpreted as  showing occasional ectopic ventricular beats. It appears to be electrical interference. Less likely would be an extremely brief run of A. fib or flutter. He has seen  Dr. Graciela Husbands; I'll ask him to review the EKG.

## 2011-11-27 ENCOUNTER — Encounter: Payer: Self-pay | Admitting: Internal Medicine

## 2011-11-27 ENCOUNTER — Ambulatory Visit (INDEPENDENT_AMBULATORY_CARE_PROVIDER_SITE_OTHER): Payer: BC Managed Care – PPO | Admitting: Internal Medicine

## 2011-11-27 VITALS — BP 118/76 | HR 55 | Temp 97.7°F | Resp 12 | Ht 73.03 in | Wt 181.2 lb

## 2011-11-27 DIAGNOSIS — Z0000 Encounter for general adult medical examination without abnormal findings: Secondary | ICD-10-CM

## 2011-11-27 DIAGNOSIS — Z85828 Personal history of other malignant neoplasm of skin: Secondary | ICD-10-CM

## 2011-11-27 DIAGNOSIS — Z23 Encounter for immunization: Secondary | ICD-10-CM

## 2011-11-27 LAB — CBC WITH DIFFERENTIAL/PLATELET
Basophils Relative: 1.2 % (ref 0.0–3.0)
Eosinophils Absolute: 0.2 10*3/uL (ref 0.0–0.7)
Hemoglobin: 13.6 g/dL (ref 13.0–17.0)
Lymphocytes Relative: 21.5 % (ref 12.0–46.0)
MCHC: 32.8 g/dL (ref 30.0–36.0)
Monocytes Relative: 9 % (ref 3.0–12.0)
Neutro Abs: 2.6 10*3/uL (ref 1.4–7.7)
Neutrophils Relative %: 63.2 % (ref 43.0–77.0)
RBC: 4.17 Mil/uL — ABNORMAL LOW (ref 4.22–5.81)
WBC: 4.1 10*3/uL — ABNORMAL LOW (ref 4.5–10.5)

## 2011-11-27 LAB — HEPATIC FUNCTION PANEL
AST: 24 U/L (ref 0–37)
Albumin: 4.2 g/dL (ref 3.5–5.2)
Alkaline Phosphatase: 47 U/L (ref 39–117)
Bilirubin, Direct: 0.1 mg/dL (ref 0.0–0.3)
Total Protein: 6.9 g/dL (ref 6.0–8.3)

## 2011-11-27 LAB — BASIC METABOLIC PANEL
CO2: 32 mEq/L (ref 19–32)
Calcium: 9.2 mg/dL (ref 8.4–10.5)
GFR: 108.29 mL/min (ref >60.00)
Potassium: 4 mEq/L (ref 3.5–5.1)
Sodium: 139 mEq/L (ref 135–145)

## 2011-11-27 LAB — LIPID PANEL
HDL: 71.4 mg/dL (ref >39.00)
VLDL: 18.8 mg/dL (ref 0.0–40.0)

## 2011-11-27 LAB — TSH: TSH: 5.38 u[IU]/mL (ref 0.35–5.50)

## 2011-11-27 MED ORDER — LORAZEPAM 1 MG PO TABS: ORAL_TABLET | ORAL | Status: DC

## 2011-11-27 NOTE — Patient Instructions (Addendum)
Exercise at least 30-45 minutes a day,  3-4 days a week.  Eat a low-fat diet with lots of fruits and vegetables, up to 7-9 servings per day.  Consume less than 40 grams of sugar per day from foods & drinks with High Fructose Corn Sugar as # 1,2,3 or # 4 on label. Please take enteric-coated aspirin 81 mg daily with breakfast. EKG is normal but there are minor ST-T changes of early repolarization. These are normal variants but could be mistaken for acute injury. This EKG should be available for comparison if  seen emergently.    If you activate My Chart; the results can be released to you as soon as they populate from the lab. If you choose not to use this program; the labs have to be reviewed, copied & mailed   causing a delay in getting the results to you.

## 2011-11-27 NOTE — Progress Notes (Signed)
  Subjective:    Patient ID: Joshua [name], male    DOB: 1955-10-28, 56 y.o.   MRN: [id number]  HPI  Joshua [name] is here for a physical; he denies acute issues.      Review of Systems He stopped his statin approximately 6 months ago because of diffuse myalgias/arthralgias. He is on a heart healthy diet. He is exercising 4-5 times per week for 20-40 minutes. He does still have occasional fluttering but denies chest pain or claudication. He has no abdominal pain, constipation, or diarrhea. Off the statin he denies significant myalgias. He's had no syncope or memory loss. He relates fatigue to increased alcohol intake and possible depression. His father had a myocardial infarction at age 70. Advanced cholesterol testing documents his LDL goal be less than 115.      Objective:   Physical Exam Gen.: thin but healthy & well-nourished in appearance. Alert, appropriate and cooperative throughout exam. Head: Normocephalic without obvious abnormalities;  Goatee & pattern alopecia  Eyes: No corneal or conjunctival inflammation noted. Pupils equal round reactive to light and accommodation. Fundal exam is benign without hemorrhages, exudate, papilledema. Extraocular motion intact. Vision grossly normal. Ears: External  ear exam reveals no significant lesions or deformities. Canals clear .TMs normal. Hearing is grossly normal bilaterally. Nose: External nasal exam reveals no deformity or inflammation. Nasal mucosa are pink and moist. No lesions or exudates noted. Septum to R  Mouth: Oral mucosa and oropharynx reveal no lesions or exudates. Teeth in good repair. Neck: No deformities, masses, or tenderness noted. Range of motion slightly decreased. Thyroid normal. Lungs: Normal respiratory effort; chest expands symmetrically. Lungs are clear to auscultation without rales, wheezes, or increased work of breathing. Heart: Slow rate and regular rhythm. Normal S1 and S2. No gallop, click, or rub.No  murmur. Abdomen: Bowel sounds normal; abdomen soft and nontender. No masses, organomegaly or hernias noted. Genitalia/DRE:Genitalia normal . Prostate is normal without enlargement, asymmetry, nodularity, or induration.  Musculoskeletal/extremities: No deformity or scoliosis noted of  the thoracic or lumbar spine. No clubbing, cyanosis, edema, or deformity noted. Range of motion  normal .Tone & strength  normal.Joints normal. Nail health  good. Vascular: Carotid, radial artery, dorsalis pedis and  posterior tibial pulses are full and equal. No bruits present. Neurologic: Alert and oriented x3. Deep tendon reflexes symmetrical and normal.          Skin: Intact without suspicious lesions or rashes. Lymph: No cervical, axillary, or inguinal lymphadenopathy present. Psych: Mood and affect are normal. Normally interactive                                                                                       Assessment & Plan:  #1 comprehensive physical exam; no acute findings #2 see Problem List with Assessments & Recommendations Plan: see Orders

## 2011-11-27 NOTE — Addendum Note (Signed)
Addended byPecola Lawless on: 11/27/2011 09:54 AM   Modules accepted: Orders

## 2011-11-30 LAB — HEMOGLOBIN A1C: Hgb A1c MFr Bld: 5.6 % (ref 4.6–6.5)

## 2011-12-24 ENCOUNTER — Telehealth: Payer: Self-pay

## 2011-12-24 NOTE — Telephone Encounter (Signed)
Received call from patient stated he has been having a weird beat off and on for the last week or more.States noticed last Thursday irregular beat more frequent.States last Thursday this weird beat lasted appox 2 to 3 hours.States had this irregular beat this past Saturday night.States has not seen Dr.Klein in a while and would like to be seen this week.States at present his heart is beating in rhythm.Message sent to Dr.Klein's nurse.

## 2011-12-25 ENCOUNTER — Telehealth: Payer: Self-pay | Admitting: Internal Medicine

## 2011-12-25 NOTE — Telephone Encounter (Signed)
Left message for pt to call and schedule appt to be seen tomorrow with Dr. Graciela Husbands.

## 2011-12-25 NOTE — Telephone Encounter (Signed)
New Problem:    Patient called in because he is unable to come in tomorrow, but was wondering if there was any way he could be seen next week.  Please call back.

## 2012-01-03 ENCOUNTER — Encounter: Payer: Self-pay | Admitting: Physician Assistant

## 2012-01-03 ENCOUNTER — Ambulatory Visit (INDEPENDENT_AMBULATORY_CARE_PROVIDER_SITE_OTHER): Payer: BC Managed Care – PPO | Admitting: Physician Assistant

## 2012-01-03 VITALS — BP 130/80 | HR 62 | Ht 73.0 in | Wt 177.0 lb

## 2012-01-03 DIAGNOSIS — R002 Palpitations: Secondary | ICD-10-CM

## 2012-01-03 NOTE — Progress Notes (Signed)
[address] [place], [place]  [address] Phone: [phone], Fax:  [phone]  Date:  01/03/2012   Name:  DELWIN [name]   DOB:  [birth year]   MRN:  [id number]  PCP:  Marga Melnick, MD  Primary Cardiologist/Primary Electrophysiologist:  Dr. Sherryl Manges    History of Present Illness: Joshua [name] is a 56 y.o. male who returns for evaluation of palpitations.    He has a hx of exercise-induced wide-complex arrhythmias. He has seen Dr. Graciela Husbands in the past. He was last seen in this office in 02/2008. Dr. Odessa Fleming last note indicates that it has been unclear in the past if his arrhythmia is ventricular or supraventricular in origin. He does have PACs as well as PVCs. Echocardiogram 5/08: EF 55-60%, trivial MR, trivial TR. He had hypotension during ETT and LHC 8/09: No CAD, normal LV function.  He has generally done well over the last several years with palpitations that have remained fairly stable. Over the last few months, he has noted increased regularity of palpitations as well as rapid heartbeats. Rapid heartbeats typically last about 5 or 10 seconds. He is an avid runner. He probably runs 3-5 miles approximately 3-4 days a week. He ran a half marathon in April. He works as a Research scientist (medical) in the [employer] and travels to [place], [place] several times a year. Shortly before going on his last trip a few weeks ago, he went for more aggressive run. The next day, and for several days later, he noted episodes of palpitations that would last 2-3 hours. His heart rate was not necessarily fast. He denies associated symptoms. He denies chest pain, dyspnea, syncope, near syncope, orthopnea, PND or edema.  Labs (10/13):    K 4, creatinine 0.8, ALT 19, Hgb 13.6, TSH 5.38  Wt Readings from Last 3 Encounters:  01/03/12 177 lb (80.287 kg)  11/27/11 181 lb 3.2 oz (82.192 kg)  11/21/10 176 lb 12.8 oz (80.196 kg)     Past Medical History  Diagnosis Date  . Prostatitis    PMH of, Dr Brunilda Payor  . Hyperlipidemia   . 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 He had hypotension during ETT and LHC 8/09: No CAD, normal LV function.   Surgical History:   The patient  has past surgical history that includes Appendectomy; Excision basal cell carcinoma (2006); Colonoscopy (2009); and Cardiac catheterization (2010).    Current Outpatient Prescriptions  Medication Sig Dispense Refill  . LORazepam (ATIVAN) 1 MG tablet 1/2 by mouth every 8-12 hrs as needed  30 tablet  3  . aspirin EC 81 MG tablet Take 81 mg by mouth daily.      . pravastatin (PRAVACHOL) 40 MG tablet Take 1 tablet (40 mg total) by mouth daily.  90 tablet  3    Allergies: Allergies  Allergen Reactions  . Pravastatin     Generalized arthralgias; med D/Ced 4/13    Social History:  The patient  reports that he has never smoked. He does not have any smokeless tobacco history on file. He reports that he drinks alcohol. He reports that he does not use illicit drugs.   Family History:   The patient's family history includes Alcohol abuse in his brother, maternal grandfather, and sister; Atrial fibrillation in his brother; Breast cancer in his mother; Diabetes in his father; Heart attack (age of onset:67) in his father; and Stroke  in his father.   ROS:  Please see the history of present illness.   He notes fatigue.  Denies a history of snoring or witnessed apneic episodes.  All other systems reviewed and negative.   PHYSICAL EXAM: VS:  BP 130/80  Pulse 62  Ht 6\' 1"  (1.854 m)  Wt 177 lb (80.287 kg)  BMI 23.35 kg/m2 Well nourished, well developed, in no acute distress HEENT: normal Neck: no JVD Endocrine: No thyromegaly Vascular: No carotid bruits Cardiac:  normal S1, S2; RRR; no murmur Lungs:  clear to auscultation bilaterally, no wheezing, rhonchi or rales Abd: soft, nontender, no hepatomegaly Ext: no edema Skin: warm and  dry Neuro:  CNs 2-12 intact, no focal abnormalities noted  EKG:  NSR, HR 62, normal axis, no acute changes      ASSESSMENT AND PLAN:  1. Palpitations:   Patient has had more frequent palpitations over the last several months. He does drink a fair amount of alcohol. We discussed cutting back on his alcohol intake. I have recommended proceeding with an event monitor as well as an echocardiogram. He would like to hold off on proceeding with this until his insurance kicks over at the first of the year. His symptoms do not appear to be unstable at this time. I think this is reasonable. Our plan will be to proceed with this method of testing after the first of the year. However, if he has worsening symptoms prior to that, he will call to have his testing arranged sooner. He will followup with Dr. Graciela Husbands in approximately 6 weeks after his echocardiogram and event monitor is placed.  He had no CAD at cath in 2009.  CHADS2=0.  He does not require ASA at this time.   Signed, Tereso Newcomer, PA-C  12:18 PM 01/03/2012

## 2012-01-03 NOTE — Addendum Note (Signed)
Addended by: Tarri Fuller on: 01/03/2012 02:26 PM   Modules accepted: Orders

## 2012-01-03 NOTE — Patient Instructions (Addendum)
Your physician has requested that you have an echocardiogram. Echocardiography is a painless test that uses sound waves to create images of your heart. It provides your doctor with information about the size and shape of your heart and how well your heart's chambers and valves are working. This procedure takes approximately one hour. There are no restrictions for this procedure.  Your physician has recommended that you wear an event monitor. Event monitors are medical devices that record the heart's electrical activity. Doctors most often [place] these monitors to diagnose arrhythmias. Arrhythmias are problems with the speed or rhythm of the heartbeat. The monitor is a small, portable device. You can wear one while you do your normal daily activities. This is usually used to diagnose what is causing palpitations/syncope (passing out).  Your physician recommends that you schedule a follow-up appointment in: 6 weeks after Echo and Event Monitor

## 2012-01-09 ENCOUNTER — Encounter: Payer: Self-pay | Admitting: Internal Medicine

## 2012-01-09 MED ORDER — PRAVASTATIN SODIUM 40 MG PO TABS: 40.0000 mg | ORAL_TABLET | Freq: Every day | ORAL | Status: DC

## 2012-02-28 ENCOUNTER — Other Ambulatory Visit (HOSPITAL_COMMUNITY): Payer: BC Managed Care – PPO

## 2012-03-11 ENCOUNTER — Telehealth: Payer: Self-pay | Admitting: *Deleted

## 2012-03-11 ENCOUNTER — Encounter: Payer: Self-pay | Admitting: Physician Assistant

## 2012-03-11 ENCOUNTER — Encounter (INDEPENDENT_AMBULATORY_CARE_PROVIDER_SITE_OTHER): Payer: BC Managed Care – PPO

## 2012-03-11 ENCOUNTER — Ambulatory Visit (HOSPITAL_COMMUNITY): Payer: BC Managed Care – PPO | Attending: Cardiology | Admitting: Radiology

## 2012-03-11 DIAGNOSIS — R002 Palpitations: Secondary | ICD-10-CM | POA: Insufficient documentation

## 2012-03-11 DIAGNOSIS — I059 Rheumatic mitral valve disease, unspecified: Secondary | ICD-10-CM | POA: Insufficient documentation

## 2012-03-11 DIAGNOSIS — I369 Nonrheumatic tricuspid valve disorder, unspecified: Secondary | ICD-10-CM | POA: Insufficient documentation

## 2012-03-11 NOTE — Progress Notes (Signed)
Echocardiogram performed.  

## 2012-03-11 NOTE — Telephone Encounter (Signed)
Message copied by Tarri Fuller on Tue Mar 11, 2012  5:10 PM ------      Message from: [place], [place] T      Created: Tue Mar 11, 2012  4:52 PM       Echo ok with      Normal LV function.      Tereso Newcomer, PA-C 03/11/2012 4:52 PM

## 2012-03-11 NOTE — Progress Notes (Unsigned)
Placed a monitor on patient and went over instructions on how to use and when to return.

## 2012-03-11 NOTE — Telephone Encounter (Signed)
lmom echo ok 

## 2012-04-16 ENCOUNTER — Ambulatory Visit: Payer: BC Managed Care – PPO | Admitting: Internal Medicine

## 2012-04-22 ENCOUNTER — Encounter: Payer: Self-pay | Admitting: Internal Medicine

## 2012-04-22 ENCOUNTER — Ambulatory Visit (INDEPENDENT_AMBULATORY_CARE_PROVIDER_SITE_OTHER): Payer: BC Managed Care – PPO | Admitting: Internal Medicine

## 2012-04-22 VITALS — BP 124/79 | HR 63 | Ht 73.0 in | Wt 179.6 lb

## 2012-04-22 DIAGNOSIS — R42 Dizziness and giddiness: Secondary | ICD-10-CM

## 2012-04-22 DIAGNOSIS — I471 Supraventricular tachycardia: Secondary | ICD-10-CM

## 2012-04-22 DIAGNOSIS — I4949 Other premature depolarization: Secondary | ICD-10-CM

## 2012-04-22 DIAGNOSIS — I498 Other specified cardiac arrhythmias: Secondary | ICD-10-CM

## 2012-04-22 DIAGNOSIS — I493 Ventricular premature depolarization: Secondary | ICD-10-CM

## 2012-04-22 NOTE — Patient Instructions (Signed)
Your physician has requested that you have an exercise tolerance test. For further information please visit [email or web address]. Please also follow instruction sheet, as given.   

## 2012-04-22 NOTE — Progress Notes (Signed)
Patient Care Team: Pecola Lawless, MD as PCP - General   HPI  Joshua [name] is a 57 y.o. male Seen after a hiatus of more than 3 years. He a history of wide-complex rhythms were associated with exercise. It is my ultimate impression that they were nonsustained ventricular tachycardia with normal coronary arteries.he also had PACs. He was seen by Tereso Newcomer 11/13 for recurrent palpitations. Echocardiogram January 14 demonstrated normal left ventricular function with normal valvular function and size chamber sizes   His palpitations are regular pauses; there are also flutters which are rapid  Alcohol and stress seem to make it worse  Past Medical History  Diagnosis Date  . Prostatitis     PMH of, Dr Brunilda Payor  . Hyperlipidemia   . 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 He had hypotension during ETT and LHC 8/09: No CAD, normal LV function.  Marland Kitchen Hx of echocardiogram     a. echo 1/14:  EF 55-60%, normal wall motion, Gr 1 diast dysfn    Past Surgical History  Procedure Laterality Date  . Appendectomy    . Basal cell carcinoma excision  2006    Dr Terri Piedra  . Colonoscopy  2009    negative; [facility]  . Cardiac catheterization  2010    for dysrrhythmias    Current Outpatient Prescriptions  Medication Sig Dispense Refill  . LORazepam (ATIVAN) 1 MG tablet 1/2 by mouth every 8-12 hrs as needed  30 tablet  3  . aspirin EC 81 MG tablet Take 81 mg by mouth daily.      . pravastatin (PRAVACHOL) 40 MG tablet Take 1 tablet (40 mg total) by mouth daily.  90 tablet  0   No current facility-administered medications for this visit.    Allergies  Allergen Reactions  . Pravastatin     Generalized arthralgias; med D/Ced 4/13    Review of Systems negative except from HPI and PMH  Physical Exam BP 124/79  Pulse 63  Ht 6\' 1"  (1.854 m)  Wt 179 lb 9.6 oz (81.466 kg)  BMI 23.7 kg/m2 Well developed and  well nourished in no acute distress HENT normal E scleral and icterus clear Neck Supple JVP flat; carotids brisk and full Clear to ausculation  Regular rate and rhythm, no murmurs gallops or rub Soft with active bowel sounds No clubbing cyanosis none Edema Alert and oriented, grossly normal motor and sensory function Skin Warm and Dry  ECG demonstrates sinus rhythm at 64 intervals 18/08/47 axis 88 Otherwise normal  Assessment and  Plan

## 2012-04-23 DIAGNOSIS — I493 Ventricular premature depolarization: Secondary | ICD-10-CM | POA: Insufficient documentation

## 2012-04-23 DIAGNOSIS — I471 Supraventricular tachycardia: Secondary | ICD-10-CM | POA: Insufficient documentation

## 2012-04-23 NOTE — Assessment & Plan Note (Signed)
Noted on monitor, will follow

## 2012-04-23 NOTE — Assessment & Plan Note (Signed)
As above.  Reviewed the benign nature of these

## 2012-05-07 ENCOUNTER — Encounter: Payer: Self-pay | Admitting: Internal Medicine

## 2012-05-09 ENCOUNTER — Other Ambulatory Visit (INDEPENDENT_AMBULATORY_CARE_PROVIDER_SITE_OTHER): Payer: BC Managed Care – PPO

## 2012-05-09 DIAGNOSIS — T887XXA Unspecified adverse effect of drug or medicament, initial encounter: Secondary | ICD-10-CM

## 2012-05-09 DIAGNOSIS — E785 Hyperlipidemia, unspecified: Secondary | ICD-10-CM

## 2012-05-09 LAB — HEPATIC FUNCTION PANEL
AST: 19 U/L (ref 0–37)
Alkaline Phosphatase: 43 U/L (ref 39–117)
Bilirubin, Direct: 0 mg/dL (ref 0.0–0.3)
Total Protein: 6.8 g/dL (ref 6.0–8.3)

## 2012-05-09 LAB — BASIC METABOLIC PANEL
BUN: 18 mg/dL (ref 6–23)
Creatinine, Ser: 0.9 mg/dL (ref 0.4–1.5)
GFR: 88.46 mL/min (ref >60.00)
Glucose, Bld: 90 mg/dL (ref 70–99)
Potassium: 4.2 mEq/L (ref 3.5–5.1)

## 2012-05-09 LAB — LDL CHOLESTEROL, DIRECT: Direct LDL: 116.4 mg/dL

## 2012-05-09 LAB — LIPID PANEL
Cholesterol: 204 mg/dL — ABNORMAL HIGH (ref 0–200)
VLDL: 12.8 mg/dL (ref 0.0–40.0)

## 2012-05-18 ENCOUNTER — Encounter: Payer: Self-pay | Admitting: Internal Medicine

## 2012-05-19 ENCOUNTER — Encounter: Payer: Self-pay | Admitting: Internal Medicine

## 2012-06-05 ENCOUNTER — Other Ambulatory Visit: Payer: Self-pay | Admitting: Internal Medicine

## 2012-06-06 NOTE — Telephone Encounter (Signed)
Patient aware Controlled Substance Contract to be sign and rx to be picked up   

## 2012-06-09 ENCOUNTER — Other Ambulatory Visit: Payer: Self-pay | Admitting: Internal Medicine

## 2012-06-11 ENCOUNTER — Ambulatory Visit (INDEPENDENT_AMBULATORY_CARE_PROVIDER_SITE_OTHER): Payer: BC Managed Care – PPO | Admitting: Internal Medicine

## 2012-06-11 DIAGNOSIS — R42 Dizziness and giddiness: Secondary | ICD-10-CM

## 2012-06-11 NOTE — Progress Notes (Signed)
Exercise Treadmill Test  Pre-Exercise Testing Evaluation Rhythm: normal sinus  Rate: 53                 Test  Exercise Tolerance Test Ordering MD: Sherryl Manges, MD  Interpreting MD: Sherryl Manges, MD  Unique Test No: 1  Treadmill:  1  Indication for ETT: Lightheadedness, fatigue  Contraindication to ETT: No   Stress Modality: exercise - treadmill  Cardiac Imaging Performed: non   Protocol: standard Bruce - maximal  Max BP:  158/81  Max MPHR (bpm):  165 85% MPR (bpm):  140  MPHR obtained (bpm):  155 % MPHR obtained:  93%  Reached 85% MPHR (min:sec):  approx 6:00 Total Exercise Time (min-sec):  approx 8:00  Workload in METS:  12.8 Borg Scale: 14  Reason ETT Terminated:  fatigue    ST Segment Analysis At Rest:  With Exercise:   Other Information Arrhythmia:   Angina during ETT:   Quality of ETT:    ETT Interpretation:  Atrial tach prior toexercise pvc afterwards   Comments: consider med threapy   Recommendations: Med therapy

## 2012-06-13 ENCOUNTER — Encounter: Payer: BC Managed Care – PPO | Admitting: Internal Medicine

## 2012-06-13 ENCOUNTER — Encounter: Payer: Self-pay | Admitting: Lab

## 2012-06-16 ENCOUNTER — Encounter: Payer: Self-pay | Admitting: Internal Medicine

## 2012-06-16 ENCOUNTER — Ambulatory Visit (INDEPENDENT_AMBULATORY_CARE_PROVIDER_SITE_OTHER): Payer: BC Managed Care – PPO | Admitting: Internal Medicine

## 2012-06-16 VITALS — BP 122/80 | HR 55 | Wt 179.0 lb

## 2012-06-16 DIAGNOSIS — R002 Palpitations: Secondary | ICD-10-CM

## 2012-06-16 DIAGNOSIS — F411 Generalized anxiety disorder: Secondary | ICD-10-CM

## 2012-06-16 MED ORDER — DULOXETINE HCL 30 MG PO CPEP: ORAL_CAPSULE | ORAL | Status: DC

## 2012-06-16 NOTE — Assessment & Plan Note (Signed)
The pathophysiology of neurotransmitter deficiency was discussed along with the benefits and potential adverse effects of Cymbalta  therapy.

## 2012-06-16 NOTE — Progress Notes (Signed)
  Subjective:    Patient ID: Joshua [name], male    DOB: Feb 06, 1956, 57 y.o.   MRN: [id number]  HPI   His extensive cardiac evaluation was reviewed and summarized in the problem list. His atrial tachycardia, nonsustained has been in the context of marked stress related to his son's health issues.  He describes himself as being "on edge, sad,overwhelmed and with loss of interest". There is some anxiety; he denies significant irritability. He has no difficulty with triaging.  He has learned his mother is on an antidepressant. His father, brother, and sister all have alcohol issues.   Review of Systems Remotely he was on Paxil with some benefit; it did cause some sexual dysfunction.  He takes lorazepam for anxiety related to long flights. He has taken it also in the last several weeks because of these issues.  He denies a constellation of headache, chest pain, flushing, and diarrhea. TSH was therapeutic last month.     Objective:   Physical Exam Gen.: Thin but healthy and well-nourished in appearance. Alert, appropriate and cooperative throughout exam.  Head: Normocephalic without obvious abnormalities;  Goatee & pattern alopecia  Eyes: No corneal or conjunctival inflammation noted. Extraocular motion intact. No lid lag or proptosis Neck: No deformities, masses, or tenderness noted. Thyroid normal. Lungs: Normal respiratory effort; chest expands symmetrically. Lungs are clear to auscultation without rales, wheezes, or increased work of breathing. Heart:Slow rate and regular rhythm. Normal S1 and S2. No gallop, click, or rub. No murmur. Abdomen: Bowel sounds normal; abdomen soft and nontender. No masses, organomegaly or hernias noted.                     Musculoskeletal/extremities: No deformity or scoliosis noted of  the thoracic or lumbar spine.  No clubbing, cyanosis, edema, or significant extremity  deformity noted.Tone & strength  Normal. Joints normal . Nail health good; no  onycholysis. Neurologic: Alert and oriented x3. Deep tendon reflexes symmetrical and normal.        Skin: Intact without suspicious lesions or rashes. Lymph: No cervical, axillary lymphadenopathy present. Psych: Mood and affect are normal. Normally interactive                                                                                        Assessment & Plan:

## 2012-06-16 NOTE — Patient Instructions (Addendum)
Cymbalta  should be titrated from 30 mg to 60 mg. After 5 -7 days at the lower dose it could be raised to 60 mg.

## 2012-06-29 ENCOUNTER — Encounter: Payer: Self-pay | Admitting: Internal Medicine

## 2012-06-30 NOTE — Telephone Encounter (Signed)
This Price is unfair; please check with your pharmacist as to the Price of  the generic Cymbalta 60 mg .Also getting three-month supply from [place] is also a viable option. In the meantime please pick up some samples of the Cymbalta 60.

## 2012-07-31 ENCOUNTER — Ambulatory Visit: Payer: BC Managed Care – PPO | Admitting: Internal Medicine

## 2012-08-28 ENCOUNTER — Encounter: Payer: Self-pay | Admitting: *Deleted

## 2012-09-01 ENCOUNTER — Ambulatory Visit (INDEPENDENT_AMBULATORY_CARE_PROVIDER_SITE_OTHER): Payer: BC Managed Care – PPO | Admitting: Internal Medicine

## 2012-09-01 ENCOUNTER — Encounter: Payer: Self-pay | Admitting: Internal Medicine

## 2012-09-01 VITALS — BP 130/84 | HR 65 | Ht 73.0 in | Wt 175.0 lb

## 2012-09-01 DIAGNOSIS — R002 Palpitations: Secondary | ICD-10-CM

## 2012-09-01 NOTE — Patient Instructions (Addendum)
Your physician recommends that you continue on your current medications as directed. Please refer to the Current Medication list given to you today.  Your physician wants you to follow-up in: 3 months. You will receive a reminder letter in the mail two months in advance. If you don't receive a letter, please call our office to schedule the follow-up appointment.  

## 2012-09-01 NOTE — Assessment & Plan Note (Signed)
One episode of prolonged palpitations lasting about 10 minutes. No significant associated untoward. We'll follow

## 2012-09-01 NOTE — Assessment & Plan Note (Signed)
As above  Terrible news!!!!!

## 2012-09-01 NOTE — Progress Notes (Signed)
Patient Care Team: Pecola Lawless, MD as PCP - General   HPI  Joshua [name] is a 57 y.o. male Seen  In followup fo  wide-complex rhythms were associated with exercise which i have thought was nonsustained ventricular tachycardia with normal coronary arteries. he also had PACs.     Echocardiogram January 14 demonstrated normal left ventricular function with normal valvular function and size chamber sizes  His palpitations are regular pauses; there are also flutters which are rapid and infrequent  His son committed suicide about 1 month ago and his mother died flollowing complications of broken hip; her funeral is tomorrow      Past Medical History  Diagnosis Date  . Prostatitis     PMH of, Dr Brunilda Payor  . Hyperlipidemia   . 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 He had hypotension during ETT and LHC 8/09: No CAD, normal LV function.  Marland Kitchen Hx of echocardiogram     a. echo 1/14:  EF 55-60%, normal wall motion, Gr 1 diast dysfn    Past Surgical History  Procedure Laterality Date  . Appendectomy    . Basal cell carcinoma excision  2006    Dr Terri Piedra  . Colonoscopy  2009    negative; [facility]  . Cardiac catheterization  2010    for dysrrhythmias    Current Outpatient Prescriptions  Medication Sig Dispense Refill  . DULoxetine (CYMBALTA) 30 MG capsule 1 qd X 5-7 days then 2 qd  30 capsule  0  . LORazepam (ATIVAN) 1 MG tablet TAKE ONE-HALF TABLET BY MOUTH AS NEEDED  30 tablet  0   No current facility-administered medications for this visit.    Allergies  Allergen Reactions  . Pravastatin     Generalized arthralgias; med D/Ced 4/13    Review of Systems negative except from HPI and PMH  Physical Exam BP 130/84  Pulse 65  Ht 6\' 1"  (1.854 m)  Wt 175 lb (79.379 kg)  BMI 23.09 kg/m2 Well developed and nourished in no acute distress HENT normal Neck supple with JVP-flat Clear Regular  rate and rhythm, no murmurs or gallops Abd-soft with active BS No Clubbing cyanosis edema Skin-warm and dry A & Oriented  Grossly normal sensory and motor function     Assessment and  Plan

## 2012-12-19 ENCOUNTER — Other Ambulatory Visit: Payer: Self-pay | Admitting: Internal Medicine

## 2012-12-20 MED ORDER — LORAZEPAM 1 MG PO TABS: ORAL_TABLET | ORAL | Status: DC

## 2012-12-20 NOTE — Telephone Encounter (Signed)
OK #30, R X2 

## 2012-12-20 NOTE — Telephone Encounter (Signed)
Lorazepam refill request Last OV 06-16-12 Med filled 06-05-12 #30 with 0 refills   [facility] on file

## 2012-12-22 ENCOUNTER — Other Ambulatory Visit: Payer: Self-pay | Admitting: General Practice

## 2012-12-22 MED ORDER — LORAZEPAM 1 MG PO TABS: ORAL_TABLET | ORAL | Status: DC

## 2012-12-22 NOTE — Telephone Encounter (Signed)
Med filled, printed, and placed on hopper's ledge for signature.

## 2013-01-01 ENCOUNTER — Other Ambulatory Visit: Payer: Self-pay

## 2013-01-05 ENCOUNTER — Other Ambulatory Visit: Payer: Self-pay | Admitting: Internal Medicine

## 2013-01-05 DIAGNOSIS — F411 Generalized anxiety disorder: Secondary | ICD-10-CM

## 2013-01-05 MED ORDER — CYMBALTA 30 MG PO CPEP: ORAL_CAPSULE | ORAL | Status: DC

## 2013-01-05 NOTE — Telephone Encounter (Signed)
Cymbalta refill sent to pharmacy 

## 2013-01-10 ENCOUNTER — Other Ambulatory Visit: Payer: Self-pay | Admitting: Internal Medicine

## 2013-01-12 NOTE — Telephone Encounter (Signed)
duloxetine refilled per protocol

## 2013-02-05 ENCOUNTER — Encounter: Payer: Self-pay | Admitting: Internal Medicine

## 2013-02-05 ENCOUNTER — Ambulatory Visit (INDEPENDENT_AMBULATORY_CARE_PROVIDER_SITE_OTHER): Payer: BC Managed Care – PPO | Admitting: Internal Medicine

## 2013-02-05 VITALS — BP 140/83 | HR 61 | Temp 97.9°F | Wt 184.2 lb

## 2013-02-05 DIAGNOSIS — Z0000 Encounter for general adult medical examination without abnormal findings: Secondary | ICD-10-CM

## 2013-02-05 DIAGNOSIS — IMO0001 Reserved for inherently not codable concepts without codable children: Secondary | ICD-10-CM

## 2013-02-05 DIAGNOSIS — R002 Palpitations: Secondary | ICD-10-CM

## 2013-02-05 DIAGNOSIS — R5381 Other malaise: Secondary | ICD-10-CM

## 2013-02-05 DIAGNOSIS — F32A Depression, unspecified: Secondary | ICD-10-CM

## 2013-02-05 DIAGNOSIS — F329 Major depressive disorder, single episode, unspecified: Secondary | ICD-10-CM

## 2013-02-05 DIAGNOSIS — F3289 Other specified depressive episodes: Secondary | ICD-10-CM

## 2013-02-05 DIAGNOSIS — M791 Myalgia, unspecified site: Secondary | ICD-10-CM

## 2013-02-05 LAB — BASIC METABOLIC PANEL
BUN: 17 mg/dL (ref 6–23)
Calcium: 8.9 mg/dL (ref 8.4–10.5)
Creatinine, Ser: 1 mg/dL (ref 0.4–1.5)
GFR: 84.08 mL/min (ref >60.00)
Glucose, Bld: 92 mg/dL (ref 70–99)

## 2013-02-05 LAB — CBC WITH DIFFERENTIAL/PLATELET
Basophils Absolute: 0 10*3/uL (ref 0.0–0.1)
HCT: 39.8 % (ref 39.0–52.0)
Lymphocytes Relative: 23.1 % (ref 12.0–46.0)
Lymphs Abs: 0.9 10*3/uL (ref 0.7–4.0)
MCHC: 33.7 g/dL (ref 30.0–36.0)
Monocytes Absolute: 0.4 10*3/uL (ref 0.1–1.0)
Monocytes Relative: 10 % (ref 3.0–12.0)
Neutrophils Relative %: 59.9 % (ref 43.0–77.0)
Platelets: 259 10*3/uL (ref 150.0–400.0)
RDW: 13 % (ref 11.5–14.6)

## 2013-02-05 LAB — LIPID PANEL
Cholesterol: 231 mg/dL — ABNORMAL HIGH (ref 0–200)
HDL: 65.7 mg/dL (ref >39.00)
Total CHOL/HDL Ratio: 4
Triglycerides: 83 mg/dL (ref 0.0–149.0)
VLDL: 16.6 mg/dL (ref 0.0–40.0)

## 2013-02-05 LAB — HEPATIC FUNCTION PANEL
AST: 20 U/L (ref 0–37)
Alkaline Phosphatase: 55 U/L (ref 39–117)
Bilirubin, Direct: 0 mg/dL (ref 0.0–0.3)
Total Bilirubin: 0.5 mg/dL (ref 0.3–1.2)
Total Protein: 7.3 g/dL (ref 6.0–8.3)

## 2013-02-05 LAB — TSH: TSH: 3.5 u[IU]/mL (ref 0.35–5.50)

## 2013-02-05 NOTE — Progress Notes (Signed)
Pre visit review using our clinic review tool, if applicable. No additional management support is needed unless otherwise documented below in the visit note. 

## 2013-02-05 NOTE — Patient Instructions (Signed)
Your next office appointment will be determined based upon review of your pending labs. Those instructions will be transmitted to you through My Chart . Please report any significant change in your symptoms.

## 2013-02-05 NOTE — Progress Notes (Signed)
Subjective:    Patient ID: Joshua [name], male    DOB: [birth year], 56 y.o.   MRN: [id number]  HPI  He is here for a physical;acute issues are delineated below.   He has had constant fatigue for approximately 3 weeks. This is in the context of emotional stress.His son committed suicide in June. He & his wife are seeing  counsellors. He does feel there is a significant component of anxiety & depression. He has had associated hoarseness and had a recent cold. The cold symptoms followed the onset of fatigue and have essentially resolved. He feels he got the respiratory tract infection from being"run down".  He's noticed some palpitations. He has a followup appointment with his cardiologist in January  Despite curtailing his rigorous physical exercise program;he continues to have muscle pain and weakness    He been sleeping up to 10 hours but still awakes fatigued. He denied any associated excessive snoring or apnea.  He has continued his Cymbalta which was initiated in April of this year.  His mother did have thyroid disease. His last TSH was 3.05 and March of this year.    Review of Systems He denies associated fever, chills, sweats, or change in weight. He's had no blurred vision, diplopia, or loss of vision.  There've been no significant extrinsic symptoms of itchy, watery eyes, or sneezing.  As noted he has been  hoarse , he has not had dysphagia.  There was no associated shortness of breath or wheezing with the respiratory tract infection  The palpitations have not been associated with chest pain, edema, paroxysmal nocturnal dyspnea  The been no change in bowels and no melena rectal bleeding.  Although he is some muscle issues he denies swelling, redness, or pain in his joints  The been no associated rashes.  He has no paresthesias or tingling in his extremities.  There's been no new change in skin hair and nails.  He has no abnormal bruising or bleeding.       Objective:   Physical Exam Gen.: Thin but healthy and well-nourished in appearance. Alert, appropriate and cooperative throughout exam.  Head: Normocephalic without obvious abnormalities; pattern alopecia  Eyes: No corneal or conjunctival inflammation noted. Pupils equal round reactive to light and accommodation. Extraocular motion intact. No lid lag , proptosis or nystagmus  Nose: External nasal exam reveals no deformity or inflammation. Nasal mucosa are pink and moist. No lesions or exudates noted.   Mouth: Oral mucosa and oropharynx reveal no lesions or exudates. Teeth in good repair. Neck: No deformities, masses, or tenderness noted. Range of motion decreased. Thyroid normal. Lungs: Normal respiratory effort; chest expands symmetrically. Lungs are clear to auscultation without rales, wheezes, or increased work of breathing. Heart: Normal rate and rhythm. Normal S1 and S2. No gallop, click, or rub. S4 w/o murmur. Abdomen: Bowel sounds normal; abdomen soft and nontender. No masses, organomegaly or hernias noted.                                 Musculoskeletal/extremities:   Accentuated curvature of lower lumbar spine. No clubbing, cyanosis, edema, or significant extremity  deformity noted. Range of motion normal .Tone & strength normal. Hand joints normal . Fingernail  health good. Able to lie down & sit up w/o help. Negative SLR bilaterally Vascular: Carotid, radial artery, dorsalis pedis and  posterior tibial pulses are full and equal. No bruits present. Neurologic: Alert and  oriented x3. Deep tendon reflexes symmetrical and normal.        Skin: Intact without suspicious lesions or rashes. Lymph: No cervical, axillary lymphadenopathy present. Psych: Mood and affect are normal. Normally interactive                                                                                         Assessment & Plan:  #1 CPX #73fatigue #3 palpitations #4depression #66myalgias See Plan

## 2013-03-02 ENCOUNTER — Encounter: Payer: BC Managed Care – PPO | Admitting: Internal Medicine

## 2013-03-30 ENCOUNTER — Encounter: Payer: Self-pay | Admitting: Internal Medicine

## 2013-04-06 ENCOUNTER — Other Ambulatory Visit: Payer: Self-pay | Admitting: Internal Medicine

## 2013-04-07 MED ORDER — DULOXETINE HCL 60 MG PO CPEP: ORAL_CAPSULE | ORAL | Status: DC

## 2013-04-07 NOTE — Telephone Encounter (Signed)
Rx sent to the pharmacy by e-script.//AB/CMA 

## 2013-04-10 NOTE — Addendum Note (Signed)
Addended by: Verdie ShireBAYNES, Javonnie Illescas M on: 04/10/2013 05:09 PM   Modules accepted: Orders

## 2013-04-10 NOTE — Telephone Encounter (Signed)
Rx was denied because if was filled on (04-07-13)AB/CMA 

## 2013-04-20 ENCOUNTER — Encounter: Payer: Self-pay | Admitting: Internal Medicine

## 2013-04-20 ENCOUNTER — Ambulatory Visit (INDEPENDENT_AMBULATORY_CARE_PROVIDER_SITE_OTHER): Payer: BC Managed Care – PPO | Admitting: Internal Medicine

## 2013-04-20 VITALS — BP 126/79 | HR 59 | Ht 73.0 in | Wt 184.0 lb

## 2013-04-20 DIAGNOSIS — R002 Palpitations: Secondary | ICD-10-CM

## 2013-04-20 DIAGNOSIS — I498 Other specified cardiac arrhythmias: Secondary | ICD-10-CM

## 2013-04-20 DIAGNOSIS — I471 Supraventricular tachycardia: Secondary | ICD-10-CM

## 2013-04-20 NOTE — Patient Instructions (Signed)
Your physician recommends that you continue on your current medications as directed. Please refer to the Current Medication list given to you today.  Your physician wants you to follow-up in: 1 year with Dr. Klein.  You will receive a reminder letter in the mail two months in advance. If you don't receive a letter, please call our office to schedule the follow-up appointment.  

## 2013-04-20 NOTE — Progress Notes (Signed)
      Patient Care Team: Pecola LawlessWilliam F Hopper, MD as PCP - General   HPI  Joshua [name] is a 58 y.o. male Seen In followup fo wide-complex rhythms   associated with exercise which i have thought was nonsustained ventricular tachycardia with normal coronary arteries. he also had PACs.   Echocardiogram January 14 demonstrated normal left ventricular function with normal valvular function and size chamber sizes   His palpitations are regular with  pauses; there are also flutters which are rapid and infrequent    His son committed suicide about last spring nd his mother died last summer  complications of broken hip    He is tired but thinks this is about depression  He and his wife are still struggling    His palpiations are relatively well controlled    Past Medical History  Diagnosis Date  . Prostatitis     PMH of, Dr Brunilda PayorNesi  . Hyperlipidemia   . 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 He had hypotension during ETT and LHC 8/09: No CAD, normal LV function.  Marland Kitchen. Hx of echocardiogram     a. echo 1/14:  EF 55-60%, normal wall motion, Gr 1 diast dysfn    Past Surgical History  Procedure Laterality Date  . Appendectomy    . Basal cell carcinoma excision  2006    Dr Terri PiedraLupton  . Colonoscopy  2009    negative; [facility]  . Cardiac catheterization  2010    for dysrrhythmias    Current Outpatient Prescriptions  Medication Sig Dispense Refill  . DULoxetine (CYMBALTA) 60 MG capsule TAKE 1 CAPSULE BY MOUTH ONCE DAILY.  90 capsule  1  . LORazepam (ATIVAN) 1 MG tablet TAKE ONE-HALF TABLET BY MOUTH AS NEEDED  30 tablet  2   No current facility-administered medications for this visit.    Allergies  Allergen Reactions  . Pravastatin     Generalized arthralgias; med D/Ced 4/13    Review of Systems negative except from HPI and PMH  Physical Exam BP 126/79  Pulse 59  Ht 6\' 1"  (1.854 m)  Wt 184  lb (83.462 kg)  BMI 24.28 kg/m2      Assessment and  Plan  Palpitations  Depression -situational   Spent about 35 min discussing the terrible situation of his sons suicide and manic depression

## 2013-07-12 ENCOUNTER — Encounter: Payer: Self-pay | Admitting: Internal Medicine

## 2013-07-13 MED ORDER — DULOXETINE HCL 60 MG PO CPEP: ORAL_CAPSULE | ORAL | Status: DC

## 2013-09-10 ENCOUNTER — Encounter: Payer: Self-pay | Admitting: Internal Medicine

## 2013-09-11 MED ORDER — LORAZEPAM 1 MG PO TABS: ORAL_TABLET | ORAL | Status: DC

## 2013-10-12 ENCOUNTER — Other Ambulatory Visit: Payer: Self-pay | Admitting: Internal Medicine

## 2013-10-12 MED ORDER — DULOXETINE HCL 60 MG PO CPEP: ORAL_CAPSULE | ORAL | Status: DC

## 2013-10-15 ENCOUNTER — Telehealth: Payer: Self-pay | Admitting: Internal Medicine

## 2013-10-15 NOTE — Telephone Encounter (Signed)
New message          C/o much more constant arrythmia and fatigue

## 2013-10-16 NOTE — Telephone Encounter (Signed)
Patient starts by telling me that he is feeling better and not particularly concerned.  He states that about 2/3 weeks ago he had an odd rhythm that lasted about an hour, no pain involved.  He recently had a nasty cold and experienced arrythmia during sickness.  Patient stating he was ok and would call back if further arrythmia arises.  My suggestion is to arrange OV (current schedule out to October for OV) for October, and to monitor himself until then. We agreed he would call back if further arrythmia occurs before OV and would consider event/holter monitoring. Informed patient that scheduler would call him to arrange OV. He is agreeable to current plan.

## 2013-10-22 ENCOUNTER — Encounter: Payer: Self-pay | Admitting: Internal Medicine

## 2013-12-10 ENCOUNTER — Ambulatory Visit: Payer: BC Managed Care – PPO | Admitting: Internal Medicine

## 2013-12-31 ENCOUNTER — Encounter: Payer: Self-pay | Admitting: Internal Medicine

## 2013-12-31 ENCOUNTER — Ambulatory Visit (INDEPENDENT_AMBULATORY_CARE_PROVIDER_SITE_OTHER): Payer: BC Managed Care – PPO | Admitting: Internal Medicine

## 2013-12-31 VITALS — BP 162/94 | HR 68 | Ht 73.0 in | Wt 188.6 lb

## 2013-12-31 DIAGNOSIS — R002 Palpitations: Secondary | ICD-10-CM

## 2013-12-31 NOTE — Progress Notes (Signed)
      Patient Care Team: Pecola LawlessWilliam F Hopper, MD as PCP - General   HPI  Joshua [name] is a 58 y.o. male Seen In followup fo wide-complex rhythms   associated with exercise which i have thought was nonsustained ventricular tachycardia with normal coronary arteries. he also had PACs.   He comes in describing an episode a couple of months ago where he awakened with sustained relatively regular palpitations. These occur in the context of heavy drinking the night before. It lasted a couple of hours.  He has been training for a  race in honor of his son and his regular palpitations have diminished significantly; they have emerged as the race approaches    Echocardiogram January 14 demonstrated normal left ventricular function with normal valvular function and size chamber sizes   His palpitations are regular with  pauses; there are also flutters which are rapid and infrequent     ng      Past Medical History  Diagnosis Date  . Prostatitis     PMH of, Dr Brunilda PayorNesi  . Hyperlipidemia   . 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 He had hypotension during ETT and LHC 8/09: No CAD, normal LV function.  Marland Kitchen. Hx of echocardiogram     a. echo 1/14:  EF 55-60%, normal wall motion, Gr 1 diast dysfn    Past Surgical History  Procedure Laterality Date  . Appendectomy    . Basal cell carcinoma excision  2006    Dr Terri PiedraLupton  . Colonoscopy  2009    negative; [facility]  . Cardiac catheterization  2010    for dysrrhythmias    Current Outpatient Prescriptions  Medication Sig Dispense Refill  . DULoxetine (CYMBALTA) 60 MG capsule TAKE 1 CAPSULE BY MOUTH ONCE DAILY. 90 capsule 0  . LORazepam (ATIVAN) 1 MG tablet TAKE ONE-HALF TABLET BY MOUTH AS NEEDED 30 tablet 0   No current facility-administered medications for this visit.    Allergies  Allergen Reactions  . Pravastatin Other (See Comments)    Generalized  arthralgias; med D/Ced 4/13    Review of Systems negative except from HPI and PMH  Physical Exam BP 162/94 mmHg  Pulse 68  Ht 6\' 1"  (1.854 m)  Wt 188 lb 9.6 oz (85.548 kg)  BMI 24.89 kg/m2  Well developed and nourished in no acute distress HENT normal Neck supple with JVP-flat Clear Regular rate and rhythm, no murmurs or gallops Abd-soft with active BS No Clubbing cyanosis edema Skin-warm and dry A & Oriented  Grossly normal sensory and motor function     Assessment and  Plan  Palpitations  Depression -situational   We will utilize an AliveCor monitor for his palpitations. He will continue on his current medications. His blood pressure today seems aberration.

## 2013-12-31 NOTE — Patient Instructions (Signed)
Your physician recommends that you continue on your current medications as directed. Please refer to the Current Medication list given to you today.  Your physician wants you to follow-up in: 6 months with Dr. Klein. You will receive a reminder letter in the mail two months in advance. If you don't receive a letter, please call our office to schedule the follow-up appointment.  

## 2014-01-11 ENCOUNTER — Other Ambulatory Visit: Payer: Self-pay | Admitting: Internal Medicine

## 2014-01-11 MED ORDER — DULOXETINE HCL 60 MG PO CPEP: ORAL_CAPSULE | ORAL | Status: DC

## 2014-03-08 ENCOUNTER — Other Ambulatory Visit (INDEPENDENT_AMBULATORY_CARE_PROVIDER_SITE_OTHER): Payer: BLUE CROSS/BLUE SHIELD

## 2014-03-08 ENCOUNTER — Encounter: Payer: Self-pay | Admitting: Internal Medicine

## 2014-03-08 ENCOUNTER — Other Ambulatory Visit: Payer: Self-pay | Admitting: Internal Medicine

## 2014-03-08 ENCOUNTER — Ambulatory Visit (INDEPENDENT_AMBULATORY_CARE_PROVIDER_SITE_OTHER): Payer: BLUE CROSS/BLUE SHIELD | Admitting: Internal Medicine

## 2014-03-08 VITALS — BP 130/90 | HR 61 | Temp 97.8°F | Resp 15 | Ht 73.0 in | Wt 198.2 lb

## 2014-03-08 DIAGNOSIS — Z0000 Encounter for general adult medical examination without abnormal findings: Secondary | ICD-10-CM

## 2014-03-08 DIAGNOSIS — E785 Hyperlipidemia, unspecified: Secondary | ICD-10-CM

## 2014-03-08 DIAGNOSIS — Z0189 Encounter for other specified special examinations: Secondary | ICD-10-CM

## 2014-03-08 DIAGNOSIS — F411 Generalized anxiety disorder: Secondary | ICD-10-CM

## 2014-03-08 LAB — CBC WITH DIFFERENTIAL/PLATELET
BASOS ABS: 0 10*3/uL (ref 0.0–0.1)
BASOS PCT: 0.8 % (ref 0.0–3.0)
Eosinophils Absolute: 0.2 10*3/uL (ref 0.0–0.7)
Eosinophils Relative: 5.7 % — ABNORMAL HIGH (ref 0.0–5.0)
HCT: 41.5 % (ref 39.0–52.0)
Hemoglobin: 14 g/dL (ref 13.0–17.0)
LYMPHS PCT: 27 % (ref 12.0–46.0)
Lymphs Abs: 0.8 10*3/uL (ref 0.7–4.0)
MCHC: 33.7 g/dL (ref 30.0–36.0)
MCV: 96.8 fl (ref 78.0–100.0)
MONO ABS: 0.4 10*3/uL (ref 0.1–1.0)
Monocytes Relative: 11.2 % (ref 3.0–12.0)
NEUTROS ABS: 1.7 10*3/uL (ref 1.4–7.7)
Neutrophils Relative %: 55.3 % (ref 43.0–77.0)
Platelets: 264 10*3/uL (ref 150.0–400.0)
RBC: 4.28 Mil/uL (ref 4.22–5.81)
RDW: 12.7 % (ref 11.5–15.5)
WBC: 3.1 10*3/uL — ABNORMAL LOW (ref 4.0–10.5)

## 2014-03-08 LAB — HEPATIC FUNCTION PANEL
ALT: 19 U/L (ref 0–53)
AST: 27 U/L (ref 0–37)
Albumin: 4.3 g/dL (ref 3.5–5.2)
Alkaline Phosphatase: 51 U/L (ref 39–117)
BILIRUBIN DIRECT: 0.1 mg/dL (ref 0.0–0.3)
BILIRUBIN TOTAL: 0.8 mg/dL (ref 0.2–1.2)
Total Protein: 7.1 g/dL (ref 6.0–8.3)

## 2014-03-08 LAB — BASIC METABOLIC PANEL
BUN: 18 mg/dL (ref 6–23)
CO2: 26 mEq/L (ref 19–32)
Calcium: 9.1 mg/dL (ref 8.4–10.5)
Chloride: 103 mEq/L (ref 96–112)
Creatinine, Ser: 1.1 mg/dL (ref 0.4–1.5)
GFR: 74.87 mL/min (ref >60.00)
Glucose, Bld: 92 mg/dL (ref 70–99)
Potassium: 4.4 mEq/L (ref 3.5–5.1)
Sodium: 137 mEq/L (ref 135–145)

## 2014-03-08 LAB — TSH: TSH: 4.39 u[IU]/mL (ref 0.35–4.50)

## 2014-03-08 NOTE — Progress Notes (Signed)
Pre visit review using our clinic review tool, if applicable. No additional management support is needed unless otherwise documented below in the visit note. 

## 2014-03-08 NOTE — Patient Instructions (Signed)
Your next office appointment will be determined based upon review of your pending labs. Those instructions will be transmitted to you through My Chart   The Psychology referral will be scheduled and you'll be notified of the time.Please call the Referral Co-Ordinator @ [phone] if you have not been notified of appointment time within 7-10 days.  Minimal Blood Pressure Goal= AVERAGE < 140/90;  Ideal is an AVERAGE < 135/85. This AVERAGE should be calculated from @ least 5-7 BP readings taken @ different times of day on different days of week. You should not respond to isolated BP readings , but rather the AVERAGE for that week .Please bring your  blood pressure cuff to office visits to verify that it is reliable.It  can also be checked against the blood pressure device at the pharmacy. Finger or wrist cuffs are not dependable; an arm cuff is.

## 2014-03-08 NOTE — Progress Notes (Signed)
Subjective:    Patient ID: Joshua [name], male    DOB: 28-Apr-1955, 59 y.o.   MRN: [id number]  HPI  He  is here for a physical;acute issues include ongoing anxiety.   He has noted his blood pressures has been mildly elevated at times recently. Blood pressure ranges 130-140/88-low 90s.  He did have an isolated episode of epistaxis last month. He has occasional nonexertional palpitations. He sees Dr Graciela HusbandsKlein , Cardiology every 6 months.  He denies other cardiopulmonary symptoms. He was able to complete a 10 km race recently without symptoms  He still is concerned about increased  anxiety since his son's death from suicide and also his increased alcohol intake. Since January 2 he has cut back on alcohol intake. He is questioning working with a Financial tradermental health professional.    Review of Systems   Chest pain,  tachycardia, exertional dyspnea, paroxysmal nocturnal dyspnea, claudication or edema are absent.      Objective:   Physical Exam  Gen.: Healthy and well-nourished in appearance. Alert, appropriate and cooperative throughout exam. Appears younger than stated age  Head: Normocephalic without obvious abnormalities; patteren alopecia . Beard & moustache Eyes: No corneal or conjunctival inflammation noted. Pupils equal round reactive to light and accommodation. Extraocular motion intact.  Vision grossly normal with /w/o lenses Ears: External  ear exam reveals no significant lesions or deformities. Wax R canal; L TM normal. Hearing is grossly normal bilaterally. Nose: External nasal exam reveals no deformity or inflammation. Nasal mucosa are pink and moist. No lesions or exudates noted. Septum to R. Mouth: Oral mucosa and oropharynx reveal no lesions or exudates. Teeth in good repair. Neck: No deformities, masses, or tenderness noted. Range of motion & Thyroid normal. Lungs: Normal respiratory effort; chest expands symmetrically. Lungs are clear to auscultation without rales, wheezes,  or increased work of breathing. Heart: Normal rate and rhythm. Normal S1 and S2. No gallop, click, or rub. Intermittent apical click w/o  murmur. Abdomen: Bowel sounds normal; abdomen soft and nontender. No masses, organomegaly or hernias noted. Genitalia: Genitalia normal except for left varices. Prostate is normal without enlargement, asymmetry, nodularity, or induration                                Musculoskeletal/extremities: No deformity or scoliosis noted of  the thoracic or lumbar spine.  No clubbing, cyanosis, edema, or significant extremity  deformity noted.  Range of motion normal . Tone & strength normal. Hand joints normal Able to lie down & sit up w/o help.  Negative SLR bilaterally Vascular: Carotid, radial artery, dorsalis pedis and  posterior tibial pulses are full and equal. No bruits present. Neurologic: Alert and oriented x3. Deep tendon reflexes symmetrical and normal.  Gait normal     Skin: Intact without suspicious lesions or rashes. Lymph: No cervical, axillary, or inguinal lymphadenopathy present. Psych: Mood and affect are normal. Normally interactive                                                                                      Assessment & Plan:  #1 comprehensive  physical exam; no acute findings #2 situational anxiety Plan: see Orders  & Recommendations

## 2014-03-10 LAB — NMR LIPOPROFILE WITH LIPIDS
Cholesterol, Total: 284 mg/dL — ABNORMAL HIGH (ref 100–199)
HDL Particle Number: 30.1 umol/L — ABNORMAL LOW (ref >=30.5)
HDL SIZE: 9.9 nm (ref >=9.2)
HDL-C: 78 mg/dL (ref >39)
LARGE HDL: 11.1 umol/L (ref >=4.8)
LDL (calc): 197 mg/dL — ABNORMAL HIGH (ref 0–99)
LDL PARTICLE NUMBER: 1674 nmol/L — AB (ref <1000)
LDL Size: 21.7 nm (ref >=20.8)
LP-IR Score: <25 (ref <=45)
Large VLDL-P: <0.8 nmol/L (ref <=2.7)
Small LDL Particle Number: 226 nmol/L (ref <=527)
Triglycerides: 43 mg/dL (ref 0–149)
VLDL SIZE: 32.2 nm (ref <=46.6)

## 2014-04-02 ENCOUNTER — Ambulatory Visit (INDEPENDENT_AMBULATORY_CARE_PROVIDER_SITE_OTHER): Payer: BLUE CROSS/BLUE SHIELD | Admitting: Licensed Clinical Social Worker

## 2014-04-02 DIAGNOSIS — F411 Generalized anxiety disorder: Secondary | ICD-10-CM

## 2014-04-12 ENCOUNTER — Other Ambulatory Visit: Payer: Self-pay | Admitting: Internal Medicine

## 2014-04-12 MED ORDER — DULOXETINE HCL 60 MG PO CPEP: ORAL_CAPSULE | ORAL | Status: DC

## 2014-04-12 MED ORDER — LORAZEPAM 1 MG PO TABS: ORAL_TABLET | ORAL | Status: DC

## 2014-04-12 NOTE — Telephone Encounter (Signed)
Lorazepam has been called to [facility] voice line Cymbalta has been electronically [facility] to [facility]

## 2014-04-26 ENCOUNTER — Ambulatory Visit (INDEPENDENT_AMBULATORY_CARE_PROVIDER_SITE_OTHER): Payer: BLUE CROSS/BLUE SHIELD | Admitting: Licensed Clinical Social Worker

## 2014-04-26 DIAGNOSIS — F411 Generalized anxiety disorder: Secondary | ICD-10-CM

## 2014-04-29 ENCOUNTER — Other Ambulatory Visit: Payer: Self-pay | Admitting: Internal Medicine

## 2014-04-29 ENCOUNTER — Encounter: Payer: Self-pay | Admitting: Internal Medicine

## 2014-04-29 DIAGNOSIS — E785 Hyperlipidemia, unspecified: Secondary | ICD-10-CM

## 2014-05-03 ENCOUNTER — Other Ambulatory Visit (INDEPENDENT_AMBULATORY_CARE_PROVIDER_SITE_OTHER): Payer: BLUE CROSS/BLUE SHIELD

## 2014-05-03 DIAGNOSIS — E785 Hyperlipidemia, unspecified: Secondary | ICD-10-CM

## 2014-05-03 LAB — LIPID PANEL
Cholesterol: 196 mg/dL (ref 0–200)
HDL: 60.7 mg/dL (ref >39.00)
LDL Cholesterol: 119 mg/dL — ABNORMAL HIGH (ref 0–99)
NonHDL: 135.3
Total CHOL/HDL Ratio: 3
Triglycerides: 81 mg/dL (ref 0.0–149.0)
VLDL: 16.2 mg/dL (ref 0.0–40.0)

## 2014-05-24 ENCOUNTER — Ambulatory Visit (INDEPENDENT_AMBULATORY_CARE_PROVIDER_SITE_OTHER): Payer: BLUE CROSS/BLUE SHIELD | Admitting: Licensed Clinical Social Worker

## 2014-05-24 DIAGNOSIS — F411 Generalized anxiety disorder: Secondary | ICD-10-CM | POA: Diagnosis not present

## 2014-06-21 ENCOUNTER — Ambulatory Visit (INDEPENDENT_AMBULATORY_CARE_PROVIDER_SITE_OTHER): Payer: BLUE CROSS/BLUE SHIELD | Admitting: Licensed Clinical Social Worker

## 2014-06-21 DIAGNOSIS — F411 Generalized anxiety disorder: Secondary | ICD-10-CM | POA: Diagnosis not present

## 2014-07-06 ENCOUNTER — Other Ambulatory Visit: Payer: Self-pay | Admitting: Internal Medicine

## 2014-07-06 ENCOUNTER — Other Ambulatory Visit: Payer: Self-pay

## 2014-07-06 MED ORDER — DULOXETINE HCL 60 MG PO CPEP: ORAL_CAPSULE | ORAL | Status: DC

## 2014-07-23 ENCOUNTER — Ambulatory Visit (INDEPENDENT_AMBULATORY_CARE_PROVIDER_SITE_OTHER): Payer: BLUE CROSS/BLUE SHIELD | Admitting: Licensed Clinical Social Worker

## 2014-07-23 DIAGNOSIS — F411 Generalized anxiety disorder: Secondary | ICD-10-CM | POA: Diagnosis not present

## 2014-09-24 ENCOUNTER — Ambulatory Visit (INDEPENDENT_AMBULATORY_CARE_PROVIDER_SITE_OTHER): Payer: BLUE CROSS/BLUE SHIELD | Admitting: Internal Medicine

## 2014-09-24 ENCOUNTER — Encounter: Payer: Self-pay | Admitting: Internal Medicine

## 2014-09-24 ENCOUNTER — Other Ambulatory Visit (INDEPENDENT_AMBULATORY_CARE_PROVIDER_SITE_OTHER): Payer: BLUE CROSS/BLUE SHIELD

## 2014-09-24 ENCOUNTER — Ambulatory Visit: Payer: BLUE CROSS/BLUE SHIELD | Admitting: Licensed Clinical Social Worker

## 2014-09-24 VITALS — BP 140/84 | HR 74 | Temp 98.2°F | Resp 16 | Wt 178.0 lb

## 2014-09-24 DIAGNOSIS — R5383 Other fatigue: Secondary | ICD-10-CM

## 2014-09-24 DIAGNOSIS — R030 Elevated blood-pressure reading, without diagnosis of hypertension: Secondary | ICD-10-CM

## 2014-09-24 DIAGNOSIS — R002 Palpitations: Secondary | ICD-10-CM

## 2014-09-24 DIAGNOSIS — T148 Other injury of unspecified body region: Secondary | ICD-10-CM

## 2014-09-24 DIAGNOSIS — T148XXA Other injury of unspecified body region, initial encounter: Secondary | ICD-10-CM

## 2014-09-24 LAB — LIPID PANEL
CHOL/HDL RATIO: 3
Cholesterol: 226 mg/dL — ABNORMAL HIGH (ref 0–200)
HDL: 82.8 mg/dL (ref >39.00)
LDL Cholesterol: 126 mg/dL — ABNORMAL HIGH (ref 0–99)
NonHDL: 143.26
Triglycerides: 88 mg/dL (ref 0.0–149.0)
VLDL: 17.6 mg/dL (ref 0.0–40.0)

## 2014-09-24 LAB — HEPATIC FUNCTION PANEL
ALBUMIN: 4.4 g/dL (ref 3.5–5.2)
ALK PHOS: 65 U/L (ref 39–117)
ALT: 12 U/L (ref 0–53)
AST: 20 U/L (ref 0–37)
BILIRUBIN DIRECT: 0.1 mg/dL (ref 0.0–0.3)
Total Bilirubin: 0.5 mg/dL (ref 0.2–1.2)
Total Protein: 7.3 g/dL (ref 6.0–8.3)

## 2014-09-24 LAB — CBC WITH DIFFERENTIAL/PLATELET
Basophils Absolute: 0 10*3/uL (ref 0.0–0.1)
Basophils Relative: 0.7 % (ref 0.0–3.0)
EOS PCT: 5.4 % — AB (ref 0.0–5.0)
Eosinophils Absolute: 0.2 10*3/uL (ref 0.0–0.7)
HCT: 38.4 % — ABNORMAL LOW (ref 39.0–52.0)
HEMOGLOBIN: 13.3 g/dL (ref 13.0–17.0)
LYMPHS PCT: 26.3 % (ref 12.0–46.0)
Lymphs Abs: 1.2 10*3/uL (ref 0.7–4.0)
MCHC: 34.5 g/dL (ref 30.0–36.0)
MCV: 96.7 fl (ref 78.0–100.0)
Monocytes Absolute: 0.5 10*3/uL (ref 0.1–1.0)
Monocytes Relative: 10.4 % (ref 3.0–12.0)
NEUTROS ABS: 2.6 10*3/uL (ref 1.4–7.7)
Neutrophils Relative %: 57.2 % (ref 43.0–77.0)
Platelets: 278 10*3/uL (ref 150.0–400.0)
RBC: 3.97 Mil/uL — ABNORMAL LOW (ref 4.22–5.81)
RDW: 13.2 % (ref 11.5–15.5)
WBC: 4.6 10*3/uL (ref 4.0–10.5)

## 2014-09-24 LAB — BASIC METABOLIC PANEL
BUN: 13 mg/dL (ref 6–23)
CHLORIDE: 99 meq/L (ref 96–112)
CO2: 32 mEq/L (ref 19–32)
CREATININE: 0.97 mg/dL (ref 0.40–1.50)
Calcium: 9.5 mg/dL (ref 8.4–10.5)
GFR: 84.58 mL/min (ref >60.00)
Glucose, Bld: 89 mg/dL (ref 70–99)
Potassium: 4.4 mEq/L (ref 3.5–5.1)
SODIUM: 137 meq/L (ref 135–145)

## 2014-09-24 LAB — TSH: TSH: 4.3 u[IU]/mL (ref 0.35–4.50)

## 2014-09-24 MED ORDER — LOSARTAN POTASSIUM 50 MG PO TABS: 50.0000 mg | ORAL_TABLET | Freq: Every day | ORAL | Status: DC

## 2014-09-24 NOTE — Patient Instructions (Signed)
Minimal Blood Pressure Goal= AVERAGE < 140/90;  Ideal is an AVERAGE < 135/85. This AVERAGE should be calculated from @ least 5-7 BP readings taken @ different times of day on different days of week. You should not respond to isolated BP readings , but rather the AVERAGE for that week .Please bring your  blood pressure cuff to office visits to verify that it is reliable.It  can also be checked against the blood pressure device at the pharmacy. Finger or wrist cuffs are not dependable; an arm cuff is. Your next office appointment will be determined based upon review of your pending labs .  Those written interpretation of the lab results and instructions will be transmitted to you by My Chart   Critical results will be called.   Followup as needed for any active or acute issue. Please report any significant change in your symptoms. 

## 2014-09-24 NOTE — Progress Notes (Signed)
   Subjective:    Patient ID: Joshua [name], male    DOB: [birth year], 59 y.o.   MRN: [id number]  HPI  He's been exercising regularly @ an extremely high level to run races again. Despite this he feels sluggish and as if his legs are heavy over the last few months. This has been intermittent and sometimes he "feels great". He runs 3-6 miles 4 times a week. He also lifts weights and does stretching exercises and yoga 2-3 times per week. He has no cardiopulmonary symptoms with  running.  He still has palpitations and sees his Cardiologist annually. He has 1 cup of coffee daily. He denies other stimulants.  He has some easy bruising. He has had isolated epistaxis. He does take 2 Aleve a day for musculoskeletal symptoms  He's on a heart healthy diet.He eats salt but is cutting down.  He has seen a counselor concerning his issues with his son's death.  Review of Systems   Chest pain,  tachycardia, exertional dyspnea, paroxysmal nocturnal dyspnea, claudication or edema are absent.  Hemoptysis, hematuria, melena, or rectal bleeding denied. No unexplained weight loss, significant dyspepsia,dysphagia, or abdominal pain.  There is no abnormal  bleeding, or difficulty stopping bleeding with injury.      Objective:   Physical Exam Pertinent or positive findings include:  Head is shaven. He has a beard and mustache. He has slight crepitus of the knees.  General appearance :Thin but adequately nourished; in no distress.  Eyes: No conjunctival inflammation or scleral icterus is present.  Oral exam:  Lips and gums are healthy appearing.There is no oropharyngeal erythema or exudate noted. Dental hygiene is good.  Heart:  Normal rate and regular rhythm. S1 and S2 normal without gallop, murmur, click, rub or other extra sounds    Lungs:Chest clear to auscultation; no wheezes, rhonchi,rales ,or rubs present.No increased work of breathing.   Abdomen: bowel sounds normal, soft and non-tender  without masses, organomegaly or hernias noted.  No guarding or rebound.   Vascular : all pulses equal ; no bruits present.  Skin:Warm & dry.  Intact without suspicious lesions or rashes ; no tenting or jaundice   Lymphatic: No lymphadenopathy is noted about the head, neck, axilla   Neuro: Strength, tone & DTRs normal.        Assessment & Plan:  #1 fatigue  #2 elevated blood pressure without diagnosis of hypertension  #3 palpitations  See orders and recommendations and after visit summary

## 2014-09-24 NOTE — Progress Notes (Signed)
Patient received education resource, including the self-management goal and tool. Patient verbalized understanding. 

## 2014-09-25 ENCOUNTER — Other Ambulatory Visit: Payer: Self-pay | Admitting: Internal Medicine

## 2014-09-25 DIAGNOSIS — D649 Anemia, unspecified: Secondary | ICD-10-CM | POA: Insufficient documentation

## 2014-09-28 ENCOUNTER — Encounter: Payer: Self-pay | Admitting: Gastroenterology

## 2014-10-01 ENCOUNTER — Encounter: Payer: Self-pay | Admitting: Internal Medicine

## 2014-10-01 ENCOUNTER — Other Ambulatory Visit: Payer: Self-pay | Admitting: Internal Medicine

## 2014-10-05 ENCOUNTER — Other Ambulatory Visit: Payer: Self-pay | Admitting: Emergency Medicine

## 2014-10-05 MED ORDER — DULOXETINE HCL 60 MG PO CPEP: ORAL_CAPSULE | ORAL | Status: DC

## 2014-10-06 ENCOUNTER — Other Ambulatory Visit (INDEPENDENT_AMBULATORY_CARE_PROVIDER_SITE_OTHER): Payer: BLUE CROSS/BLUE SHIELD

## 2014-10-06 ENCOUNTER — Ambulatory Visit (INDEPENDENT_AMBULATORY_CARE_PROVIDER_SITE_OTHER): Payer: BLUE CROSS/BLUE SHIELD | Admitting: Licensed Clinical Social Worker

## 2014-10-06 DIAGNOSIS — F411 Generalized anxiety disorder: Secondary | ICD-10-CM

## 2014-10-06 DIAGNOSIS — D649 Anemia, unspecified: Secondary | ICD-10-CM | POA: Diagnosis not present

## 2014-10-06 LAB — CBC WITH DIFFERENTIAL/PLATELET
Basophils Absolute: 0 10*3/uL (ref 0.0–0.1)
Basophils Relative: 0.9 % (ref 0.0–3.0)
EOS ABS: 0.2 10*3/uL (ref 0.0–0.7)
EOS PCT: 5.2 % — AB (ref 0.0–5.0)
HEMATOCRIT: 37.5 % — AB (ref 39.0–52.0)
HEMOGLOBIN: 13 g/dL (ref 13.0–17.0)
LYMPHS ABS: 0.9 10*3/uL (ref 0.7–4.0)
Lymphocytes Relative: 21 % (ref 12.0–46.0)
MCHC: 34.7 g/dL (ref 30.0–36.0)
MCV: 97.7 fl (ref 78.0–100.0)
MONO ABS: 0.4 10*3/uL (ref 0.1–1.0)
Monocytes Relative: 9.2 % (ref 3.0–12.0)
NEUTROS PCT: 63.7 % (ref 43.0–77.0)
Neutro Abs: 2.7 10*3/uL (ref 1.4–7.7)
Platelets: 261 10*3/uL (ref 150.0–400.0)
RBC: 3.84 Mil/uL — ABNORMAL LOW (ref 4.22–5.81)
RDW: 13.5 % (ref 11.5–15.5)
WBC: 4.3 10*3/uL (ref 4.0–10.5)

## 2014-10-06 LAB — IBC PANEL
IRON: 112 ug/dL (ref 42–165)
Saturation Ratios: 29.3 % (ref 20.0–50.0)
TRANSFERRIN: 273 mg/dL (ref 212.0–360.0)

## 2014-10-06 LAB — VITAMIN B12: VITAMIN B 12: 267 pg/mL (ref 211–911)

## 2014-10-08 ENCOUNTER — Other Ambulatory Visit: Payer: Self-pay | Admitting: Internal Medicine

## 2014-10-08 DIAGNOSIS — D649 Anemia, unspecified: Secondary | ICD-10-CM

## 2014-11-18 ENCOUNTER — Other Ambulatory Visit (INDEPENDENT_AMBULATORY_CARE_PROVIDER_SITE_OTHER): Payer: BLUE CROSS/BLUE SHIELD

## 2014-11-18 DIAGNOSIS — D649 Anemia, unspecified: Secondary | ICD-10-CM | POA: Diagnosis not present

## 2014-11-18 LAB — CBC
HCT: 39.7 % (ref 39.0–52.0)
Hemoglobin: 13.3 g/dL (ref 13.0–17.0)
MCHC: 33.5 g/dL (ref 30.0–36.0)
MCV: 99.1 fl (ref 78.0–100.0)
PLATELETS: 256 10*3/uL (ref 150.0–400.0)
RBC: 4.01 Mil/uL — ABNORMAL LOW (ref 4.22–5.81)
RDW: 13.2 % (ref 11.5–15.5)
WBC: 5.1 10*3/uL (ref 4.0–10.5)

## 2014-11-20 LAB — METHYLMALONIC ACID, SERUM: Methylmalonic Acid, Quant: 189 nmol/L (ref 87–318)

## 2015-01-03 ENCOUNTER — Other Ambulatory Visit: Payer: Self-pay | Admitting: Internal Medicine

## 2015-01-04 MED ORDER — DULOXETINE HCL 60 MG PO CPEP: ORAL_CAPSULE | ORAL | Status: DC

## 2015-01-04 NOTE — Addendum Note (Signed)
Addended by: Tonye BecketAIRRIKIER, Shakayla Hickox M on: 01/04/2015 04:03 PM   Modules accepted: Orders

## 2015-01-07 ENCOUNTER — Other Ambulatory Visit: Payer: Self-pay | Admitting: Internal Medicine

## 2015-01-07 ENCOUNTER — Other Ambulatory Visit: Payer: Self-pay

## 2015-01-07 MED ORDER — DULOXETINE HCL 60 MG PO CPEP: ORAL_CAPSULE | ORAL | Status: DC

## 2015-01-17 ENCOUNTER — Ambulatory Visit (INDEPENDENT_AMBULATORY_CARE_PROVIDER_SITE_OTHER): Payer: BLUE CROSS/BLUE SHIELD | Admitting: Licensed Clinical Social Worker

## 2015-01-17 DIAGNOSIS — F411 Generalized anxiety disorder: Secondary | ICD-10-CM

## 2015-01-18 ENCOUNTER — Other Ambulatory Visit: Payer: Self-pay | Admitting: Internal Medicine

## 2015-01-18 ENCOUNTER — Encounter: Payer: Self-pay | Admitting: Internal Medicine

## 2015-01-18 DIAGNOSIS — R5383 Other fatigue: Secondary | ICD-10-CM

## 2015-01-24 ENCOUNTER — Other Ambulatory Visit (INDEPENDENT_AMBULATORY_CARE_PROVIDER_SITE_OTHER): Payer: BLUE CROSS/BLUE SHIELD

## 2015-01-24 DIAGNOSIS — R5383 Other fatigue: Secondary | ICD-10-CM | POA: Diagnosis not present

## 2015-01-24 LAB — HEPATIC FUNCTION PANEL
ALBUMIN: 4.1 g/dL (ref 3.5–5.2)
ALK PHOS: 62 U/L (ref 39–117)
ALT: 18 U/L (ref 0–53)
AST: 23 U/L (ref 0–37)
BILIRUBIN TOTAL: 0.8 mg/dL (ref 0.2–1.2)
Bilirubin, Direct: 0.1 mg/dL (ref 0.0–0.3)
Total Protein: 6.8 g/dL (ref 6.0–8.3)

## 2015-01-24 LAB — C-REACTIVE PROTEIN

## 2015-01-24 LAB — CBC WITH DIFFERENTIAL/PLATELET
BASOS ABS: 0 10*3/uL (ref 0.0–0.1)
Basophils Relative: 0.8 % (ref 0.0–3.0)
Eosinophils Absolute: 0.3 10*3/uL (ref 0.0–0.7)
Eosinophils Relative: 6.2 % — ABNORMAL HIGH (ref 0.0–5.0)
HCT: 41.2 % (ref 39.0–52.0)
HEMOGLOBIN: 13.9 g/dL (ref 13.0–17.0)
LYMPHS ABS: 1.2 10*3/uL (ref 0.7–4.0)
Lymphocytes Relative: 28.8 % (ref 12.0–46.0)
MCHC: 33.9 g/dL (ref 30.0–36.0)
MCV: 98.3 fl (ref 78.0–100.0)
MONOS PCT: 10.5 % (ref 3.0–12.0)
Monocytes Absolute: 0.4 10*3/uL (ref 0.1–1.0)
NEUTROS PCT: 53.7 % (ref 43.0–77.0)
Neutro Abs: 2.2 10*3/uL (ref 1.4–7.7)
Platelets: 275 10*3/uL (ref 150.0–400.0)
RBC: 4.19 Mil/uL — AB (ref 4.22–5.81)
RDW: 12.8 % (ref 11.5–15.5)
WBC: 4.1 10*3/uL (ref 4.0–10.5)

## 2015-01-24 LAB — SEDIMENTATION RATE: SED RATE: 12 mm/h (ref 0–22)

## 2015-01-24 LAB — T4, FREE: Free T4: 0.53 ng/dL — ABNORMAL LOW (ref 0.60–1.60)

## 2015-01-24 LAB — TSH: TSH: 6.85 u[IU]/mL — ABNORMAL HIGH (ref 0.35–4.50)

## 2015-01-26 ENCOUNTER — Encounter: Payer: Self-pay | Admitting: Internal Medicine

## 2015-02-02 ENCOUNTER — Ambulatory Visit: Payer: Self-pay | Admitting: Internal Medicine

## 2015-02-08 ENCOUNTER — Ambulatory Visit: Payer: BLUE CROSS/BLUE SHIELD

## 2015-02-10 ENCOUNTER — Ambulatory Visit (INDEPENDENT_AMBULATORY_CARE_PROVIDER_SITE_OTHER): Payer: BLUE CROSS/BLUE SHIELD | Admitting: Internal Medicine

## 2015-02-10 ENCOUNTER — Encounter: Payer: Self-pay | Admitting: Internal Medicine

## 2015-02-10 VITALS — BP 126/76 | HR 64 | Temp 97.6°F | Ht 73.0 in | Wt 182.0 lb

## 2015-02-10 DIAGNOSIS — R946 Abnormal results of thyroid function studies: Secondary | ICD-10-CM | POA: Diagnosis not present

## 2015-02-10 DIAGNOSIS — E039 Hypothyroidism, unspecified: Secondary | ICD-10-CM | POA: Insufficient documentation

## 2015-02-10 DIAGNOSIS — R7989 Other specified abnormal findings of blood chemistry: Secondary | ICD-10-CM

## 2015-02-10 DIAGNOSIS — R5383 Other fatigue: Secondary | ICD-10-CM | POA: Diagnosis not present

## 2015-02-10 DIAGNOSIS — Z23 Encounter for immunization: Secondary | ICD-10-CM

## 2015-02-10 MED ORDER — LEVOTHYROXINE SODIUM 25 MCG PO TABS
25.0000 ug | ORAL_TABLET | Freq: Every day | ORAL | Status: DC
Start: 2015-02-10 — End: 2015-03-30

## 2015-02-10 NOTE — Progress Notes (Signed)
   Subjective:    Patient ID: Joshua [name], male    DOB: [birth year], 59 y.o.   MRN: [id number]  HPI  Since Fall he has had fatigue without definite localizing symptoms. He's had aching in his muscles but this is related to increased physical activity. He questions whether this is related to depression as he has seasonal affective symptoms. He states that his appetite is good ; he sleeps excessively. There are no associated symptoms of sleep apnea  He's been on a heart healthy diet with sodium restriction. He is not taking losartan; blood pressure averages 125/75. He exercises 5 days a week for 30-40 minutes without associated cardiopulmonary symptoms  He's had occasional irregular beats at rest which he relates to possible intake of alcohol or caffeine. He questions whether these might have had brief episodes of atrial fib. He has a follow-up with his cardiologist pending.  He continues the generic Cymbalta. He is seeing Dr Rolm BaptiseWhitt, Psychologist. He does use lorazepam when he flies and also every few weeks if the does have sleep disruption  Review of systems is positive for nocturia 1-2 times per night. He has slight cold intolerance.  TSH was  6.85 and free T4 0.53 on 01/24/15.   Review of Systems  Chest pain, tachycardia, exertional dyspnea, paroxysmal nocturnal dyspnea, claudication or edema are absent. No unexplained weight loss, abdominal pain, significant dyspepsia, dysphagia, melena, rectal bleeding, or persistently small caliber stools. Dysuria, pyuria, hematuria, frequency,  or polyuria are denied. Change in hair, skin, nails denied. No bowel changes of constipation or diarrhea. No intolerance to heat .     Objective:   Physical Exam Pertinent or positive findings include: Pattern alopecia is present. He has a IT consultantgoatee. There is decreased cervical range of motion. He has mild crepitus of the knees.  General appearance :adequately nourished; in no distress.  Eyes: No  conjunctival inflammation or scleral icterus is present.  Oral exam:  Lips and gums are healthy appearing.There is no oropharyngeal erythema or exudate noted. Dental hygiene is good.  Heart:  Normal rate and regular rhythm. S1 and S2 normal without gallop, murmur, click, rub or other extra sounds    Lungs:Chest clear to auscultation; no wheezes, rhonchi,rales ,or rubs present.No increased work of breathing.   Abdomen: bowel sounds normal, soft and non-tender without masses, organomegaly or hernias noted.  No guarding or rebound. No flank tenderness to percussion.  Vascular : all pulses equal ; no bruits present.  Skin:Warm & dry.  Intact without suspicious lesions or rashes ; no tenting or jaundice   Lymphatic: No lymphadenopathy is noted about the head, neck, axilla, or inguinal areas.   Neuro: Strength, tone & DTRs normal.     Assessment & Plan:  #1 fatigue  #2 elevated TSH  See orders

## 2015-02-10 NOTE — Patient Instructions (Signed)
Repeat TSH in 10-11 weeks

## 2015-02-10 NOTE — Progress Notes (Signed)
Pre visit review using our clinic review tool, if applicable. No additional management support is needed unless otherwise documented below in the visit note. 

## 2015-03-15 ENCOUNTER — Encounter: Payer: Self-pay | Admitting: Internal Medicine

## 2015-03-29 ENCOUNTER — Encounter: Payer: Self-pay | Admitting: Internal Medicine

## 2015-03-29 ENCOUNTER — Ambulatory Visit (INDEPENDENT_AMBULATORY_CARE_PROVIDER_SITE_OTHER): Payer: BLUE CROSS/BLUE SHIELD | Admitting: Internal Medicine

## 2015-03-29 ENCOUNTER — Other Ambulatory Visit (INDEPENDENT_AMBULATORY_CARE_PROVIDER_SITE_OTHER): Payer: BLUE CROSS/BLUE SHIELD

## 2015-03-29 VITALS — BP 130/78 | HR 56 | Temp 97.8°F | Resp 14 | Ht 73.0 in | Wt 184.0 lb

## 2015-03-29 DIAGNOSIS — E039 Hypothyroidism, unspecified: Secondary | ICD-10-CM

## 2015-03-29 DIAGNOSIS — E785 Hyperlipidemia, unspecified: Secondary | ICD-10-CM

## 2015-03-29 DIAGNOSIS — Z0000 Encounter for general adult medical examination without abnormal findings: Secondary | ICD-10-CM

## 2015-03-29 DIAGNOSIS — R946 Abnormal results of thyroid function studies: Secondary | ICD-10-CM | POA: Diagnosis not present

## 2015-03-29 DIAGNOSIS — R7989 Other specified abnormal findings of blood chemistry: Secondary | ICD-10-CM

## 2015-03-29 LAB — LIPID PANEL
CHOL/HDL RATIO: 4
Cholesterol: 232 mg/dL — ABNORMAL HIGH (ref 0–200)
HDL: 61.9 mg/dL (ref >39.00)
LDL CALC: 154 mg/dL — AB (ref 0–99)
NONHDL: 169.98
TRIGLYCERIDES: 80 mg/dL (ref 0.0–149.0)
VLDL: 16 mg/dL (ref 0.0–40.0)

## 2015-03-29 LAB — TSH
TSH: 3.92 u[IU]/mL (ref 0.35–4.50)
TSH: 4.16 u[IU]/mL (ref 0.35–4.50)

## 2015-03-29 LAB — T4, FREE: Free T4: 0.7 ng/dL (ref 0.60–1.60)

## 2015-03-29 MED ORDER — DULOXETINE HCL 60 MG PO CPEP: ORAL_CAPSULE | ORAL | Status: DC

## 2015-03-29 NOTE — Patient Instructions (Signed)
We will check the labs today and adjust your medicines as needed.

## 2015-03-29 NOTE — Progress Notes (Signed)
Pre visit review using our clinic review tool, if applicable. No additional management support is needed unless otherwise documented below in the visit note. 

## 2015-03-29 NOTE — Progress Notes (Signed)
   Subjective:    Patient ID: Joshua [name], male    DOB: [birth year], 60 y.o.   MRN: [id number]  HPI The patient is a 60 YO man coming in for wellness and follow up of his thyroid. Started on thyroid medicine 1-2 months ago. Feeling better since starting the medicine. No complaints or concerns today.  PMH, [facility], social history reviewed and updated.   Review of Systems  Constitutional: Negative for fever, activity change, appetite change, fatigue and unexpected weight change.  HENT: Negative.   Eyes: Negative.   Respiratory: Negative for cough, chest tightness, shortness of breath and wheezing.   Cardiovascular: Negative for chest pain, palpitations and leg swelling.  Gastrointestinal: Negative for nausea, abdominal pain, diarrhea, constipation and abdominal distention.  Endocrine: Negative.   Musculoskeletal: Positive for arthralgias. Negative for myalgias, back pain and joint swelling.  Skin: Negative.   Neurological: Negative for dizziness, syncope, weakness, light-headedness and headaches.  Psychiatric/Behavioral: Positive for dysphoric mood. Negative for behavioral problems, sleep disturbance, self-injury and decreased concentration. The patient is not nervous/anxious.       Objective:   Physical Exam  Constitutional: He is oriented to person, place, and time. He appears well-developed and well-nourished.  HENT:  Head: Normocephalic and atraumatic.  Eyes: EOM are normal.  Neck: Normal range of motion.  Cardiovascular: Normal rate and regular rhythm.   Pulmonary/Chest: Effort normal and breath sounds normal. No respiratory distress. He has no wheezes. He has no rales.  Abdominal: Soft. Bowel sounds are normal. He exhibits no distension. There is no tenderness. There is no rebound.  Musculoskeletal: He exhibits no edema.  Neurological: He is alert and oriented to person, place, and time.  Skin: Skin is warm and dry.  Psychiatric: He has a normal mood and affect.   Filed  Vitals:   03/29/15 0907  BP: 130/78  Pulse: 56  Temp: 97.8 F (36.6 C)  TempSrc: Oral  Resp: 14  Height:  (1.854 m)  Weight: 184 lb (83.462 kg)  SpO2: 99%      Assessment & Plan:

## 2015-03-30 ENCOUNTER — Other Ambulatory Visit: Payer: Self-pay | Admitting: Internal Medicine

## 2015-03-30 DIAGNOSIS — Z0000 Encounter for general adult medical examination without abnormal findings: Secondary | ICD-10-CM | POA: Insufficient documentation

## 2015-03-30 DIAGNOSIS — R7989 Other specified abnormal findings of blood chemistry: Secondary | ICD-10-CM

## 2015-03-30 MED ORDER — LEVOTHYROXINE SODIUM 25 MCG PO TABS: 25.0000 ug | ORAL_TABLET | Freq: Every day | ORAL | Status: DC

## 2015-03-30 NOTE — Assessment & Plan Note (Signed)
Up to date on colonoscopy and immunizations. Counseled on regular exercise for stress relief and given screening recommendations.

## 2015-03-30 NOTE — Assessment & Plan Note (Signed)
Just started on medicine and checking TSH and free T4 and adjust as needed.

## 2015-04-07 ENCOUNTER — Encounter: Payer: Self-pay | Admitting: Internal Medicine

## 2015-04-08 ENCOUNTER — Other Ambulatory Visit: Payer: Self-pay | Admitting: Geriatric Medicine

## 2015-04-08 MED ORDER — DULOXETINE HCL 60 MG PO CPEP: ORAL_CAPSULE | ORAL | Status: DC

## 2015-04-08 NOTE — Telephone Encounter (Signed)
Pt is out of this med.  Can this be sent in today ?

## 2015-04-20 ENCOUNTER — Encounter: Payer: Self-pay | Admitting: Internal Medicine

## 2015-04-20 DIAGNOSIS — E039 Hypothyroidism, unspecified: Secondary | ICD-10-CM

## 2015-06-16 ENCOUNTER — Ambulatory Visit: Payer: Self-pay | Admitting: Internal Medicine

## 2015-06-20 ENCOUNTER — Telehealth: Payer: Self-pay | Admitting: Internal Medicine

## 2015-06-21 MED ORDER — LORAZEPAM 1 MG PO TABS: 0.5000 mg | ORAL_TABLET | Freq: Every day | ORAL | Status: DC | PRN

## 2015-06-21 NOTE — Telephone Encounter (Signed)
Left msg on triage statingnhe sent email needing refill on his Lorazepam. Not sure if Dr. Okey Duprerawford receive because he was apt of Dr. Alwyn RenHopper. Requesting refill ASAP...Raechel Chute/lmb

## 2015-06-21 NOTE — Telephone Encounter (Signed)
Script fax to [facility] notified pt through his my-chart msg...Raechel Chute/lmb

## 2015-06-21 NOTE — Telephone Encounter (Signed)
Do not see an email in the system but printed and signed.

## 2015-06-21 NOTE — Addendum Note (Signed)
Addended by: Hillard DankerRAWFORD, ELIZABETH A on: 06/21/2015 09:49 AM   Modules accepted: Orders

## 2015-08-08 ENCOUNTER — Encounter: Payer: Self-pay | Admitting: Internal Medicine

## 2015-08-08 ENCOUNTER — Ambulatory Visit (INDEPENDENT_AMBULATORY_CARE_PROVIDER_SITE_OTHER): Payer: Managed Care, Other (non HMO) | Admitting: Internal Medicine

## 2015-08-08 VITALS — BP 126/84 | HR 61 | Ht 73.0 in | Wt 186.6 lb

## 2015-08-08 DIAGNOSIS — R5383 Other fatigue: Secondary | ICD-10-CM

## 2015-08-08 DIAGNOSIS — R002 Palpitations: Secondary | ICD-10-CM | POA: Diagnosis not present

## 2015-08-08 DIAGNOSIS — R0602 Shortness of breath: Secondary | ICD-10-CM

## 2015-08-08 NOTE — Patient Instructions (Signed)

## 2015-08-08 NOTE — Progress Notes (Signed)
The nurses meter [name], MD as PCP - General (Internal Medicine)   HPI  Joshua [name] is a 60 y.o. male Seen In followup fo wide-complex rhythms   associated with exercise which i have thought was nonsustained ventricular tachycardia with normal coronary arteries. he also had PACs.   He comes in describing an episode a couple of months ago where he awakened with sustained relatively regular palpitations. These occur in the context of heavy drinking the night before. It lasted a couple of hours.  He has been training for a  race in honor of his son and his regular palpitations have diminished significantly; they have emerged as the race approaches   He had a wonderful time related to his daughters recent wedding and sons graduation    Echocardiogram January 14 demonstrated normal left ventricular function with normal valvular function and size chamber sizes   His palpitations are regular with  pauses; there are also flutters which are rapid and infrequent   He has some edema          Past Medical History  Diagnosis Date  . Prostatitis     PMH of, Dr Brunilda PayorNesi  . Hyperlipidemia   . 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 He had hypotension during ETT and LHC 8/09: No CAD, normal LV function.  Marland Kitchen. Hx of echocardiogram     Echo 1/14:  EF 55-60%, normal wall motion, Gr 1 diast dysfn    Past Surgical History  Procedure Laterality Date  . Appendectomy    . Basal cell carcinoma excision  2006    Dr Terri PiedraLupton  . Colonoscopy  2009    negative; [facility]  . Cardiac catheterization  2010    for dysrrhythmias    Current Outpatient Prescriptions  Medication Sig Dispense Refill  . DULoxetine (CYMBALTA) 60 MG capsule TAKE 1 CAPSULE BY MOUTH ONCE DAILY. 90 capsule 3  . levothyroxine (LEVOTHROID) 25 MCG tablet Take 1 tablet (25 mcg total) by mouth daily before  breakfast. 90 tablet 1  . LORazepam (ATIVAN) 1 MG tablet Take 0.5 tablets (0.5 mg total) by mouth daily as needed for anxiety. 30 tablet 0   No current facility-administered medications for this visit.    Allergies  Allergen Reactions  . Pravastatin Other (See Comments)    Generalized arthralgias; med D/Ced 4/13    Review of Systems negative except from HPI and PMH  Physical Exam BP 126/84 mmHg  Pulse 61  Ht 6\' 1"  (1.854 m)  Wt 186 lb 9.6 oz (84.641 kg)  BMI 24.62 kg/m2  Well developed and nourished in no acute distress HENT normal Neck supple with JVP-flat Clear Regular rate and rhythm, no murmurs or gallops Abd-soft with active BS No Clubbing cyanosis edema Skin-warm and dry A & Oriented  Grossly normal sensory and motor function     Assessment and  Plan  Palpitations   Depression -situationaln  Improved  yeah   We will utilize an Alive[employer] monitor for his palpitations. He will continue on his current medications.    We spent more than 50% of our >25 min visit in face to face counseling regarding the above

## 2015-08-17 ENCOUNTER — Encounter: Payer: Self-pay | Admitting: Internal Medicine

## 2015-08-17 DIAGNOSIS — E039 Hypothyroidism, unspecified: Secondary | ICD-10-CM

## 2015-08-17 DIAGNOSIS — E785 Hyperlipidemia, unspecified: Secondary | ICD-10-CM

## 2015-08-18 ENCOUNTER — Other Ambulatory Visit (INDEPENDENT_AMBULATORY_CARE_PROVIDER_SITE_OTHER): Payer: Managed Care, Other (non HMO)

## 2015-08-18 DIAGNOSIS — E039 Hypothyroidism, unspecified: Secondary | ICD-10-CM | POA: Diagnosis not present

## 2015-08-18 DIAGNOSIS — E785 Hyperlipidemia, unspecified: Secondary | ICD-10-CM | POA: Diagnosis not present

## 2015-08-18 LAB — LIPID PANEL
Cholesterol: 229 mg/dL — ABNORMAL HIGH (ref 0–200)
HDL: 64.6 mg/dL (ref >39.00)
LDL Cholesterol: 138 mg/dL — ABNORMAL HIGH (ref 0–99)
NonHDL: 164.35
TRIGLYCERIDES: 133 mg/dL (ref 0.0–149.0)
Total CHOL/HDL Ratio: 4
VLDL: 26.6 mg/dL (ref 0.0–40.0)

## 2015-08-18 LAB — T4, FREE: FREE T4: 0.67 ng/dL (ref 0.60–1.60)

## 2015-08-18 LAB — TSH: TSH: 7.05 u[IU]/mL — AB (ref 0.35–4.50)

## 2015-08-23 ENCOUNTER — Encounter: Payer: Self-pay | Admitting: Internal Medicine

## 2015-08-23 DIAGNOSIS — E039 Hypothyroidism, unspecified: Secondary | ICD-10-CM

## 2015-08-23 MED ORDER — LEVOTHYROXINE SODIUM 50 MCG PO TABS: 50.0000 ug | ORAL_TABLET | Freq: Every day | ORAL | Status: DC

## 2015-09-23 ENCOUNTER — Ambulatory Visit (INDEPENDENT_AMBULATORY_CARE_PROVIDER_SITE_OTHER): Payer: Managed Care, Other (non HMO) | Admitting: Physician Assistant

## 2015-09-23 ENCOUNTER — Encounter: Payer: Self-pay | Admitting: Physician Assistant

## 2015-09-23 ENCOUNTER — Telehealth: Payer: Self-pay | Admitting: Internal Medicine

## 2015-09-23 VITALS — BP 144/86 | HR 58 | Ht 72.0 in | Wt 185.8 lb

## 2015-09-23 DIAGNOSIS — E039 Hypothyroidism, unspecified: Secondary | ICD-10-CM

## 2015-09-23 DIAGNOSIS — I493 Ventricular premature depolarization: Secondary | ICD-10-CM

## 2015-09-23 DIAGNOSIS — I491 Atrial premature depolarization: Secondary | ICD-10-CM | POA: Diagnosis not present

## 2015-09-23 DIAGNOSIS — R002 Palpitations: Secondary | ICD-10-CM | POA: Diagnosis not present

## 2015-09-23 DIAGNOSIS — I472 Ventricular tachycardia: Secondary | ICD-10-CM

## 2015-09-23 DIAGNOSIS — I471 Supraventricular tachycardia: Secondary | ICD-10-CM

## 2015-09-23 DIAGNOSIS — R001 Bradycardia, unspecified: Secondary | ICD-10-CM

## 2015-09-23 DIAGNOSIS — I4729 Other ventricular tachycardia: Secondary | ICD-10-CM | POA: Insufficient documentation

## 2015-09-23 LAB — CBC WITH DIFFERENTIAL/PLATELET
BASOS PCT: 2 %
Basophils Absolute: 76 cells/uL (ref 0–200)
EOS ABS: 228 {cells}/uL (ref 15–500)
EOS PCT: 6 %
HCT: 41.1 % (ref 38.5–50.0)
Hemoglobin: 13.7 g/dL (ref 13.2–17.1)
LYMPHS PCT: 25 %
Lymphs Abs: 950 cells/uL (ref 850–3900)
MCH: 33 pg (ref 27.0–33.0)
MCHC: 33.3 g/dL (ref 32.0–36.0)
MCV: 99 fL (ref 80.0–100.0)
MONOS PCT: 13 %
MPV: 9.4 fL (ref 7.5–12.5)
Monocytes Absolute: 494 cells/uL (ref 200–950)
Neutro Abs: 2052 cells/uL (ref 1500–7800)
Neutrophils Relative %: 54 %
Platelets: 272 10*3/uL (ref 140–400)
RBC: 4.15 MIL/uL — AB (ref 4.20–5.80)
RDW: 13.2 % (ref 11.0–15.0)
WBC: 3.8 10*3/uL (ref 3.8–10.8)

## 2015-09-23 LAB — BASIC METABOLIC PANEL
BUN: 16 mg/dL (ref 7–25)
CALCIUM: 9.6 mg/dL (ref 8.6–10.3)
CO2: 27 mmol/L (ref 20–31)
CREATININE: 0.94 mg/dL (ref 0.70–1.33)
Chloride: 99 mmol/L (ref 98–110)
Glucose, Bld: 84 mg/dL (ref 65–99)
Potassium: 4.5 mmol/L (ref 3.5–5.3)
Sodium: 137 mmol/L (ref 135–146)

## 2015-09-23 LAB — MAGNESIUM: Magnesium: 2.1 mg/dL (ref 1.5–2.5)

## 2015-09-23 LAB — TSH: TSH: 6.05 m[IU]/L — AB (ref 0.40–4.50)

## 2015-09-23 LAB — T4, FREE: FREE T4: 1 ng/dL (ref 0.8–1.8)

## 2015-09-23 NOTE — Progress Notes (Signed)
Cardiology Office Note    Date:  09/23/2015  ID:  Joshua [name], DOB 1955-11-06, MRN [id number] PCP:  Myrlene Broker, MD  Cardiologist:  Graciela Husbands   Chief Complaint: palpitations  History of Present Illness:  Joshua [name] is a 60 y.o. male with history of hyperlipidemia, hypothyroidism, prostatitis, NSVT vs SVT, PACs, atrial tach who presents for f/u palpitations. Per Dr. Odessa Fleming note he has history of wide-complex rhythms associated with exercise felt to be nonsustained VT (Versus SVT). He underwent cardiac cath in 2009 due to BP drop with exercise and 3 beats of VT- this showed no significant CAD. 2D echo 02/2012 showed EF 55-60%, grade 1 DD, no RMWA. Event monitor 02/2012 showed correlation of palpitations with PACs, PVCs, and short atrial runs. ETT 05/2014 (8 min) showed atrial tach prior to exercise and PVCs afterwards. Labs in 07/2015: high TSH, normal free T4, LDL 138 -> recommended to consider increasing levothyroxine to 50 but the patient elected to stay at .  For the past several weeks he has had increasing palpitations in a "regularly irregular" pattern. This can last up to 12 hours at a time. He feels it as a sense of palpitations. He has no chest pain or SOB. His palpitations improve with exercise - he runs regularly. He is training for a 5k. His palpitations seem to have persisted recently regardless in change in caffeine intake. The only thing he's noticed differently lately is that he's been drinking a protein shake daily instead of egg whites.  He captured several of these episodes on his Alive Cor and it has shown PACs occasionally in a bigeminal pattern.  Past Medical History:  Diagnosis Date  . Abnormal patient-activated cardiac event monitor    a.  Event monitor 02/2012 showed correlation of palpitations with PACs, PVCs, and short atrial runs.  Marland Kitchen Hx of echocardiogram    Echo 1/14:  EF 55-60%, normal wall motion, Gr 1 diast dysfn  . Hyperlipidemia   .  Hypothyroidism   . Irregular cardiac rhythm 2010   exercise induced WCT (SVT vs VT);  Echocardiogram 5/08: EF 55-60%, trivial MR, trivial TR. He had hypotension during ETT and LHC 8/09: No CAD, normal LV function.  Marland Kitchen NSVT (nonsustained ventricular tachycardia) (HCC)   . PAT (paroxysmal atrial tachycardia) (HCC)   . Premature atrial contractions   . Prostatitis    PMH of, Dr Brunilda Payor  . PVC's (premature ventricular contractions)     Past Surgical History:  Procedure Laterality Date  . APPENDECTOMY    . BASAL CELL CARCINOMA EXCISION  2006   Dr Terri Piedra  . CARDIAC CATHETERIZATION  2010   for dysrrhythmias  . COLONOSCOPY  2009   negative; [facility]    Current Medications: Current Outpatient Prescriptions  Medication Sig Dispense Refill  . DULoxetine (CYMBALTA) 60 MG capsule TAKE 1 CAPSULE BY MOUTH ONCE DAILY. 90 capsule 3  . levothyroxine (SYNTHROID, LEVOTHROID) 25 MCG tablet Take 25 mcg by mouth daily before breakfast.    . LORazepam (ATIVAN) 1 MG tablet Take 0.5 tablets (0.5 mg total) by mouth daily as needed for anxiety. 30 tablet 0   No current facility-administered medications for this visit.      Allergies:   Pravastatin   Social History   Social History  . Marital status: Married    Spouse name: N/A  . Number of children: N/A  . Years of education: N/A   Occupational History  . 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 Smoking status: Never Smoker  . Smokeless tobacco: Never Used  . Alcohol use 0.0 oz/week     Comment:  10-15 drinks/ week; none since 02/27/14  . Drug use: No  . Sexual activity: Not Asked   Other Topics Concern  . None   Social History Narrative  . None     Family History:  The patient's family history includes Alcohol abuse in his brother, maternal grandfather, and sister; Atrial fibrillation in his brother; Breast cancer in his mother; COPD in his sister; Diabetes in his father; Healthy in his  sister; Heart attack (age of onset: 64) in his father; Mental illness in his son; Stroke (age of onset: 26) in his father.   ROS:   Please see the history of present illness.  All other systems are reviewed and otherwise negative.    PHYSICAL EXAM:   VS:  BP (!) 144/86   Pulse (!) 58   Ht 6' (1.829 m)   Wt 185 lb 12.8 oz (84.3 kg)   BMI 25.20 kg/m   BMI: Body mass index is 25.2 kg/m. GEN: Well nourished, well developed WM in no acute distress - fit appearing HEENT: normocephalic, atraumatic Neck: no JVD, carotid bruits, or masses Cardiac: RRR; no murmurs, rubs, or gallops, no edema  Respiratory:  clear to auscultation bilaterally, normal work of breathing GI: soft, nontender, nondistended, + BS MS: no deformity or atrophy  Skin: warm and dry, no rash Neuro:  Alert and Oriented x 3, Strength and sensation are intact, follows commands Psych: euthymic mood, full affect  Wt Readings from Last 3 Encounters:  09/23/15 185 lb 12.8 oz (84.3 kg)  08/08/15 186 lb 9.6 oz (84.6 kg)  03/29/15 184 lb (83.5 kg)      Studies/Labs Reviewed:   EKG:  EKG was ordered today and personally reviewed by me and demonstrates sinus bradycardia 58bpm, no acute ST-T changes, QRS  Recent Labs: 09/24/2014: BUN 13; Creatinine, Ser 0.97; Potassium 4.4; Sodium 137 01/24/2015: ALT 18; Hemoglobin 13.9; Platelets 275.0 08/18/2015: TSH 7.05   Lipid Panel    Component Value Date/Time   CHOL 229 (H) 08/18/2015 0834   CHOL 284 (H) 03/08/2014 1147   TRIG 133.0 08/18/2015 0834   TRIG 43 03/08/2014 1147   TRIG 94 02/01/2006 0934   HDL 64.60 08/18/2015 0834   HDL 78 03/08/2014 1147   CHOLHDL 4 08/18/2015 0834   VLDL 26.6 08/18/2015 0834   LDLCALC 138 (H) 08/18/2015 0834   LDLCALC 197 (H) 03/08/2014 1147   LDLDIRECT 158.7 02/05/2013 1108    Additional studies/ records that were reviewed today include: Summarized above.    ASSESSMENT & PLAN:   1. Palpitations with prior hx of NSVT vs SVT, PAT,  PVCs, PACs - his most recent palpitations correlate with atrial bigeminy. Will recheck labs to exclude any metabolic abnormality that is contributing. Will also recheck thyroid function to trend as he may benefit from going up on levothyroxine to as previously considered by primary care. Based on labs will make further recommendations as to medical therapy. His baseline HR in the 50's is prohibitive of aggressive AV nodal blocking. No high risk sx. Continue monitoring with [employer]Cor. 2. Hypothyroidism - see above. Recheck thyroid function. 3. Sinus bradycardia - asymptomatic with this. Suspect baseline HR runs in the 50s due to high level of fitness.  Disposition: F/u with Dr. Graciela Husbands in 3 months, sooner if needed.  Medication Adjustments/Labs and Tests Ordered: Current medicines are reviewed at length with the patient today.  Concerns regarding medicines are outlined above. Medication changes, Labs and Tests ordered today are summarized above and listed in the Patient Instructions accessible in Encounters.   Thomasene Mohair PA-C  09/23/2015 10:29 AM    [facility] [address] [place], [place], [place]  [address] Phone: [phone]; Fax: [phone]

## 2015-09-23 NOTE — Telephone Encounter (Signed)
New Message  Pt calling and verbalized Unusual change in rhythm behavior more than 12hrs at a time.

## 2015-09-23 NOTE — Telephone Encounter (Signed)
Spoke with patient who states he has had a change in heart rhythm and frequency of fatigue associated with abnormal rhythms.  He has ordered monitor for phone and has images that he hopes captured what is going on.  States rhythm does not feel fast but has sustained irregularity  [employer] monitor reported SR with PAC's.  He states he also wonders if it could be atrial fib (has brother with a fib).  Increase in symptoms associated with eating.  States he is still running regularly - no issues with exercise; states it is usually hours later that he notices increased irregular rhythm and fatigue.  He states Dr. Graciela Husbands advised him to call back if he has change in frequency or symptoms of irregular rhythm.  I was able to schedule him to see Ronie Spies, PA today at 10:00.  He thanked me for the quick response and states he will bring rhythm strips with him.

## 2015-09-23 NOTE — Patient Instructions (Signed)
Medication Instructions:  Your physician recommends that you continue on your current medications as directed. Please refer to the Current Medication list given to you today.   Labwork: Bmet, Mag, Cbc, Tsh, Free T4  Testing/Procedures: None ordered  Follow-Up: Your physician recommends that you schedule a follow-up appointment in: 3 months with Dr.Klein   Any Other Special Instructions Will Be Listed Below (If Applicable).     If you need a refill on your cardiac medications before your next appointment, please call your pharmacy.

## 2015-09-27 ENCOUNTER — Encounter: Payer: Self-pay | Admitting: Physician Assistant

## 2015-09-27 ENCOUNTER — Telehealth: Payer: Self-pay | Admitting: Internal Medicine

## 2015-09-27 NOTE — Telephone Encounter (Signed)
Follow-up ° ° ° ° ° °The pt is returning Jennifer's call °

## 2015-09-27 NOTE — Telephone Encounter (Signed)
-----   Message from Laurann [place], [place] sent at 09/26/2015  7:21 AM EDT ----- See first result note - just wanted to also add that we should consider treating the thyroid disorder and if PACs are still present, then we can consider low dose beta blocker. His pulse rate limits our ability to be aggressive with this so if PACs improve with thyroid treatment then we may not need to prescribe anything. Dayna Dunn PA-C

## 2015-09-27 NOTE — Telephone Encounter (Signed)
Returned pt's call. No answer. Will try again later

## 2015-09-27 NOTE — Telephone Encounter (Signed)
Returned pts call.  Discussed lab results.

## 2015-09-28 ENCOUNTER — Encounter: Payer: Self-pay | Admitting: Physician Assistant

## 2015-10-03 ENCOUNTER — Encounter: Payer: Self-pay | Admitting: Physician Assistant

## 2015-10-04 ENCOUNTER — Encounter: Payer: Self-pay | Admitting: Internal Medicine

## 2015-10-05 ENCOUNTER — Encounter: Payer: Self-pay | Admitting: Physician Assistant

## 2015-12-15 ENCOUNTER — Encounter: Payer: Self-pay | Admitting: Internal Medicine

## 2015-12-27 ENCOUNTER — Ambulatory Visit (INDEPENDENT_AMBULATORY_CARE_PROVIDER_SITE_OTHER): Payer: 59 | Admitting: Internal Medicine

## 2015-12-27 ENCOUNTER — Encounter: Payer: Self-pay | Admitting: Internal Medicine

## 2015-12-27 VITALS — BP 140/80 | HR 63 | Ht 72.0 in | Wt 190.8 lb

## 2015-12-27 DIAGNOSIS — R002 Palpitations: Secondary | ICD-10-CM

## 2015-12-27 DIAGNOSIS — I491 Atrial premature depolarization: Secondary | ICD-10-CM

## 2015-12-27 NOTE — Progress Notes (Signed)
The nurses meter Dr.     Patient Care Team: Joshua BrokerElizabeth A Crawford, MD as PCP - General (Internal Medicine)   HPI  Joshua [name] is a 60 y.o. male Seen In followup fo wide-complex rhythms   associated with exercise which i have thought was nonsustained ventricular tachycardia with normal coronary arteries. he also had PACs.   He comes in describing an episode a couple of months ago where he awakened with sustained relatively regular palpitations. These occur in the context of heavy drinking the night before. It lasted a couple of hours.  He has been training for a  race in honor of his son and his regular palpitations have diminished significantly; they have emerged as the race approaches   He had a wonderful time related to his daughters recent wedding and sons graduation   History of wide-complex rhythms associated with exercise felt to be nonsustained VT (Versus SVT). He underwent cardiac cath in 2009 due to BP drop with exercise and 3 beats of VT- this showed no significant CAD.   Echocardiogram January 14 demonstrated normal left ventricular function with normal valvular function and size chamber sizes   His palpitations are regular with  pauses; there are also flutters which are rapid and infrequent. He utilized an Engineer, structural[employer] monitor demonstrated bigeminal PACs  He has some edema    He is treated hypothyroidism. 7/17 Cr 0.9  K4.5  TSH 6.05 Synthroid 25 6/17     LDL 138       Past Medical History:  Diagnosis Date  . Abnormal patient-activated cardiac event monitor    a.  Event monitor 02/2012 showed correlation of palpitations with PACs, PVCs, and short atrial runs.  . Atrial bigeminy   . Hx of echocardiogram    Echo 1/14:  EF 55-60%, normal wall motion, Gr 1 diast dysfn  . Hyperlipidemia   . Hypothyroidism   . Irregular cardiac rhythm 2010   exercise induced WCT (SVT vs VT);  Echocardiogram 5/08: EF 55-60%, trivial MR, trivial TR. He had hypotension during ETT and LHC  8/09: No CAD, normal LV function.  Marland Kitchen. NSVT (nonsustained ventricular tachycardia) (HCC)   . PAT (paroxysmal atrial tachycardia) (HCC)   . Premature atrial contractions   . Prostatitis    PMH of, Dr Brunilda PayorNesi  . PVC's (premature ventricular contractions)     Past Surgical History:  Procedure Laterality Date  . APPENDECTOMY    . BASAL CELL CARCINOMA EXCISION  2006   Dr Terri PiedraLupton  . CARDIAC CATHETERIZATION  2010   for dysrrhythmias  . COLONOSCOPY  2009   negative; [facility]    Current Outpatient Prescriptions  Medication Sig Dispense Refill  . DULoxetine (CYMBALTA) 60 MG capsule TAKE 1 CAPSULE BY MOUTH ONCE DAILY. 90 capsule 3  . levothyroxine (SYNTHROID, LEVOTHROID) 25 MCG tablet Take 25 mcg by mouth daily before breakfast.    . LORazepam (ATIVAN) 1 MG tablet Take 0.5 tablets (0.5 mg total) by mouth daily as needed for anxiety. 30 tablet 0   No current facility-administered medications for this visit.     Allergies  Allergen Reactions  . Pravastatin Other (See Comments)    Generalized arthralgias; med D/Ced 4/13    Review of Systems negative except from HPI and PMH  Physical Exam Ht 6' (1.829 m)   Well developed and nourished in no acute distress HENT normal Neck supple with JVP-flat Clear Regular rate and rhythm, no murmurs or gallops Abd-soft with active BS No Clubbing cyanosis edema Skin-warm  and dry A & Oriented  Grossly normal sensory and motor function  ECG demonstrates sinus at 60 Intervals 18/08/41 Assessment and  Plan  Palpitations   Depression -situational  Improved  yeah   He is doing better with fewer palpitations. Exercise tolerance is improving.

## 2015-12-27 NOTE — Patient Instructions (Signed)
Medication Instructions: Your physician recommends that you continue on your current medications as directed. Please refer to the Current Medication list given to you today.   Labwork: None Ordered  Procedures/Testing: None Ordered  Follow-Up: Your physician wants you to follow-up in: 1 YEAR with Dr. Klein. You will receive a reminder letter in the mail two months in advance. If you don't receive a letter, please call our office to schedule the follow-up appointment.   Any Additional Special Instructions Will Be Listed Below (If Applicable).     If you need a refill on your cardiac medications before your next appointment, please call your pharmacy.   

## 2016-03-08 ENCOUNTER — Encounter: Payer: Self-pay | Admitting: Physician Assistant

## 2016-06-04 ENCOUNTER — Other Ambulatory Visit: Payer: Self-pay | Admitting: Internal Medicine

## 2016-06-04 MED ORDER — LORAZEPAM 1 MG PO TABS: 0.5000 mg | ORAL_TABLET | Freq: Every day | ORAL | 2 refills | Status: DC | PRN

## 2016-06-25 DIAGNOSIS — M5137 Other intervertebral disc degeneration, lumbosacral region: Secondary | ICD-10-CM | POA: Diagnosis not present

## 2016-06-25 DIAGNOSIS — M9904 Segmental and somatic dysfunction of sacral region: Secondary | ICD-10-CM | POA: Diagnosis not present

## 2016-06-25 DIAGNOSIS — M5136 Other intervertebral disc degeneration, lumbar region: Secondary | ICD-10-CM | POA: Diagnosis not present

## 2016-06-25 DIAGNOSIS — M9903 Segmental and somatic dysfunction of lumbar region: Secondary | ICD-10-CM | POA: Diagnosis not present

## 2016-06-27 ENCOUNTER — Telehealth: Payer: Self-pay

## 2016-06-27 ENCOUNTER — Encounter: Payer: Self-pay | Admitting: Internal Medicine

## 2016-06-27 DIAGNOSIS — M9904 Segmental and somatic dysfunction of sacral region: Secondary | ICD-10-CM | POA: Diagnosis not present

## 2016-06-27 DIAGNOSIS — M9903 Segmental and somatic dysfunction of lumbar region: Secondary | ICD-10-CM | POA: Diagnosis not present

## 2016-06-27 DIAGNOSIS — M5137 Other intervertebral disc degeneration, lumbosacral region: Secondary | ICD-10-CM | POA: Diagnosis not present

## 2016-06-27 DIAGNOSIS — M5136 Other intervertebral disc degeneration, lumbar region: Secondary | ICD-10-CM | POA: Diagnosis not present

## 2016-06-27 MED ORDER — DULOXETINE HCL 60 MG PO CPEP: 60.0000 mg | ORAL_CAPSULE | Freq: Every day | ORAL | 0 refills | Status: DC

## 2016-06-27 NOTE — Telephone Encounter (Deleted)
Please advise 

## 2016-06-27 NOTE — Telephone Encounter (Signed)
error 

## 2016-06-28 DIAGNOSIS — M9903 Segmental and somatic dysfunction of lumbar region: Secondary | ICD-10-CM | POA: Diagnosis not present

## 2016-06-28 DIAGNOSIS — M5136 Other intervertebral disc degeneration, lumbar region: Secondary | ICD-10-CM | POA: Diagnosis not present

## 2016-06-28 DIAGNOSIS — M5137 Other intervertebral disc degeneration, lumbosacral region: Secondary | ICD-10-CM | POA: Diagnosis not present

## 2016-06-28 DIAGNOSIS — M9904 Segmental and somatic dysfunction of sacral region: Secondary | ICD-10-CM | POA: Diagnosis not present

## 2016-07-02 DIAGNOSIS — M9904 Segmental and somatic dysfunction of sacral region: Secondary | ICD-10-CM | POA: Diagnosis not present

## 2016-07-02 DIAGNOSIS — M5136 Other intervertebral disc degeneration, lumbar region: Secondary | ICD-10-CM | POA: Diagnosis not present

## 2016-07-02 DIAGNOSIS — M5137 Other intervertebral disc degeneration, lumbosacral region: Secondary | ICD-10-CM | POA: Diagnosis not present

## 2016-07-02 DIAGNOSIS — M9903 Segmental and somatic dysfunction of lumbar region: Secondary | ICD-10-CM | POA: Diagnosis not present

## 2016-07-04 DIAGNOSIS — M5136 Other intervertebral disc degeneration, lumbar region: Secondary | ICD-10-CM | POA: Diagnosis not present

## 2016-07-04 DIAGNOSIS — M9903 Segmental and somatic dysfunction of lumbar region: Secondary | ICD-10-CM | POA: Diagnosis not present

## 2016-07-04 DIAGNOSIS — M5137 Other intervertebral disc degeneration, lumbosacral region: Secondary | ICD-10-CM | POA: Diagnosis not present

## 2016-07-04 DIAGNOSIS — M9904 Segmental and somatic dysfunction of sacral region: Secondary | ICD-10-CM | POA: Diagnosis not present

## 2016-07-05 DIAGNOSIS — M5136 Other intervertebral disc degeneration, lumbar region: Secondary | ICD-10-CM | POA: Diagnosis not present

## 2016-07-05 DIAGNOSIS — M5137 Other intervertebral disc degeneration, lumbosacral region: Secondary | ICD-10-CM | POA: Diagnosis not present

## 2016-07-05 DIAGNOSIS — M9903 Segmental and somatic dysfunction of lumbar region: Secondary | ICD-10-CM | POA: Diagnosis not present

## 2016-07-05 DIAGNOSIS — M9904 Segmental and somatic dysfunction of sacral region: Secondary | ICD-10-CM | POA: Diagnosis not present

## 2016-07-09 DIAGNOSIS — M5137 Other intervertebral disc degeneration, lumbosacral region: Secondary | ICD-10-CM | POA: Diagnosis not present

## 2016-07-09 DIAGNOSIS — M5136 Other intervertebral disc degeneration, lumbar region: Secondary | ICD-10-CM | POA: Diagnosis not present

## 2016-07-09 DIAGNOSIS — M9904 Segmental and somatic dysfunction of sacral region: Secondary | ICD-10-CM | POA: Diagnosis not present

## 2016-07-09 DIAGNOSIS — M9903 Segmental and somatic dysfunction of lumbar region: Secondary | ICD-10-CM | POA: Diagnosis not present

## 2016-07-11 DIAGNOSIS — M5136 Other intervertebral disc degeneration, lumbar region: Secondary | ICD-10-CM | POA: Diagnosis not present

## 2016-07-11 DIAGNOSIS — M9903 Segmental and somatic dysfunction of lumbar region: Secondary | ICD-10-CM | POA: Diagnosis not present

## 2016-07-11 DIAGNOSIS — M5137 Other intervertebral disc degeneration, lumbosacral region: Secondary | ICD-10-CM | POA: Diagnosis not present

## 2016-07-11 DIAGNOSIS — M9904 Segmental and somatic dysfunction of sacral region: Secondary | ICD-10-CM | POA: Diagnosis not present

## 2016-07-12 DIAGNOSIS — M9904 Segmental and somatic dysfunction of sacral region: Secondary | ICD-10-CM | POA: Diagnosis not present

## 2016-07-12 DIAGNOSIS — M9903 Segmental and somatic dysfunction of lumbar region: Secondary | ICD-10-CM | POA: Diagnosis not present

## 2016-07-12 DIAGNOSIS — M5137 Other intervertebral disc degeneration, lumbosacral region: Secondary | ICD-10-CM | POA: Diagnosis not present

## 2016-07-12 DIAGNOSIS — M5136 Other intervertebral disc degeneration, lumbar region: Secondary | ICD-10-CM | POA: Diagnosis not present

## 2016-07-16 DIAGNOSIS — M5137 Other intervertebral disc degeneration, lumbosacral region: Secondary | ICD-10-CM | POA: Diagnosis not present

## 2016-07-16 DIAGNOSIS — M5136 Other intervertebral disc degeneration, lumbar region: Secondary | ICD-10-CM | POA: Diagnosis not present

## 2016-07-16 DIAGNOSIS — M9904 Segmental and somatic dysfunction of sacral region: Secondary | ICD-10-CM | POA: Diagnosis not present

## 2016-07-16 DIAGNOSIS — M9903 Segmental and somatic dysfunction of lumbar region: Secondary | ICD-10-CM | POA: Diagnosis not present

## 2016-07-18 DIAGNOSIS — M9904 Segmental and somatic dysfunction of sacral region: Secondary | ICD-10-CM | POA: Diagnosis not present

## 2016-07-18 DIAGNOSIS — M9903 Segmental and somatic dysfunction of lumbar region: Secondary | ICD-10-CM | POA: Diagnosis not present

## 2016-07-18 DIAGNOSIS — M5136 Other intervertebral disc degeneration, lumbar region: Secondary | ICD-10-CM | POA: Diagnosis not present

## 2016-07-18 DIAGNOSIS — M5137 Other intervertebral disc degeneration, lumbosacral region: Secondary | ICD-10-CM | POA: Diagnosis not present

## 2016-07-19 DIAGNOSIS — M5137 Other intervertebral disc degeneration, lumbosacral region: Secondary | ICD-10-CM | POA: Diagnosis not present

## 2016-07-19 DIAGNOSIS — M5136 Other intervertebral disc degeneration, lumbar region: Secondary | ICD-10-CM | POA: Diagnosis not present

## 2016-07-19 DIAGNOSIS — M9904 Segmental and somatic dysfunction of sacral region: Secondary | ICD-10-CM | POA: Diagnosis not present

## 2016-07-19 DIAGNOSIS — M9903 Segmental and somatic dysfunction of lumbar region: Secondary | ICD-10-CM | POA: Diagnosis not present

## 2016-07-24 DIAGNOSIS — M9904 Segmental and somatic dysfunction of sacral region: Secondary | ICD-10-CM | POA: Diagnosis not present

## 2016-07-24 DIAGNOSIS — M5137 Other intervertebral disc degeneration, lumbosacral region: Secondary | ICD-10-CM | POA: Diagnosis not present

## 2016-07-24 DIAGNOSIS — M5136 Other intervertebral disc degeneration, lumbar region: Secondary | ICD-10-CM | POA: Diagnosis not present

## 2016-07-24 DIAGNOSIS — M9903 Segmental and somatic dysfunction of lumbar region: Secondary | ICD-10-CM | POA: Diagnosis not present

## 2016-07-26 ENCOUNTER — Encounter: Payer: Self-pay | Admitting: Internal Medicine

## 2016-07-26 ENCOUNTER — Ambulatory Visit (INDEPENDENT_AMBULATORY_CARE_PROVIDER_SITE_OTHER): Payer: BLUE CROSS/BLUE SHIELD | Admitting: Internal Medicine

## 2016-07-26 ENCOUNTER — Other Ambulatory Visit (INDEPENDENT_AMBULATORY_CARE_PROVIDER_SITE_OTHER): Payer: BLUE CROSS/BLUE SHIELD

## 2016-07-26 VITALS — BP 140/82 | HR 57 | Temp 97.6°F | Resp 12 | Ht 72.0 in | Wt 186.0 lb

## 2016-07-26 DIAGNOSIS — Z1159 Encounter for screening for other viral diseases: Secondary | ICD-10-CM | POA: Diagnosis not present

## 2016-07-26 DIAGNOSIS — F411 Generalized anxiety disorder: Secondary | ICD-10-CM | POA: Diagnosis not present

## 2016-07-26 DIAGNOSIS — E785 Hyperlipidemia, unspecified: Secondary | ICD-10-CM | POA: Diagnosis not present

## 2016-07-26 DIAGNOSIS — Z0000 Encounter for general adult medical examination without abnormal findings: Secondary | ICD-10-CM

## 2016-07-26 DIAGNOSIS — E039 Hypothyroidism, unspecified: Secondary | ICD-10-CM

## 2016-07-26 DIAGNOSIS — M5137 Other intervertebral disc degeneration, lumbosacral region: Secondary | ICD-10-CM | POA: Diagnosis not present

## 2016-07-26 DIAGNOSIS — M9903 Segmental and somatic dysfunction of lumbar region: Secondary | ICD-10-CM | POA: Diagnosis not present

## 2016-07-26 DIAGNOSIS — M9904 Segmental and somatic dysfunction of sacral region: Secondary | ICD-10-CM | POA: Diagnosis not present

## 2016-07-26 DIAGNOSIS — M5136 Other intervertebral disc degeneration, lumbar region: Secondary | ICD-10-CM | POA: Diagnosis not present

## 2016-07-26 LAB — LIPID PANEL
CHOL/HDL RATIO: 3
Cholesterol: 261 mg/dL — ABNORMAL HIGH (ref 0–200)
HDL: 76.6 mg/dL (ref >39.00)
LDL Cholesterol: 171 mg/dL — ABNORMAL HIGH (ref 0–99)
NONHDL: 184.18
Triglycerides: 68 mg/dL (ref 0.0–149.0)
VLDL: 13.6 mg/dL (ref 0.0–40.0)

## 2016-07-26 LAB — CBC
HEMATOCRIT: 40.6 % (ref 39.0–52.0)
Hemoglobin: 13.9 g/dL (ref 13.0–17.0)
MCHC: 34.3 g/dL (ref 30.0–36.0)
MCV: 98.3 fl (ref 78.0–100.0)
PLATELETS: 255 10*3/uL (ref 150.0–400.0)
RBC: 4.13 Mil/uL — ABNORMAL LOW (ref 4.22–5.81)
RDW: 13.1 % (ref 11.5–15.5)
WBC: 3.6 10*3/uL — ABNORMAL LOW (ref 4.0–10.5)

## 2016-07-26 LAB — HEPATITIS C ANTIBODY: HCV Ab: NEGATIVE

## 2016-07-26 LAB — TSH: TSH: 4.37 u[IU]/mL (ref 0.35–4.50)

## 2016-07-26 LAB — COMPREHENSIVE METABOLIC PANEL
ALK PHOS: 56 U/L (ref 39–117)
ALT: 14 U/L (ref 0–53)
AST: 21 U/L (ref 0–37)
Albumin: 4.4 g/dL (ref 3.5–5.2)
BUN: 14 mg/dL (ref 6–23)
CO2: 31 meq/L (ref 19–32)
Calcium: 9.4 mg/dL (ref 8.4–10.5)
Chloride: 101 mEq/L (ref 96–112)
Creatinine, Ser: 0.96 mg/dL (ref 0.40–1.50)
GFR: 85.06 mL/min (ref >60.00)
Glucose, Bld: 103 mg/dL — ABNORMAL HIGH (ref 70–99)
POTASSIUM: 5.1 meq/L (ref 3.5–5.1)
Sodium: 137 mEq/L (ref 135–145)
TOTAL PROTEIN: 7.1 g/dL (ref 6.0–8.3)
Total Bilirubin: 0.5 mg/dL (ref 0.2–1.2)

## 2016-07-26 LAB — T4, FREE: FREE T4: 0.75 ng/dL (ref 0.60–1.60)

## 2016-07-26 LAB — HEMOGLOBIN A1C: Hgb A1c MFr Bld: 5.7 % (ref 4.6–6.5)

## 2016-07-26 MED ORDER — ZOSTER VAC RECOMB ADJUVANTED 50 MCG/0.5ML IM SUSR: 0.5000 mL | Freq: Once | INTRAMUSCULAR | 1 refills | Status: AC

## 2016-07-26 NOTE — Assessment & Plan Note (Signed)
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

## 2016-07-26 NOTE — Assessment & Plan Note (Signed)
Did increase thyroid to 50 mcg daily but never came for follow up labs, checking TSH and free T4 today and adjust as needed.

## 2016-07-26 NOTE — Progress Notes (Signed)
   Subjective:    Patient ID: Joshua [name], male    DOB: 1955/05/20, 61 y.o.   MRN: [id number]  HPI The patient is a 61 YO man coming in for wellness. No new concerns.   [facility], [facility] reviewed and updated.   Review of Systems  Constitutional: Negative.   HENT: Negative.   Eyes: Negative.   Respiratory: Negative for cough, chest tightness and shortness of breath.   Cardiovascular: Negative for chest pain, palpitations and leg swelling.  Gastrointestinal: Negative for abdominal distention, abdominal pain, constipation, diarrhea, nausea and vomiting.  Musculoskeletal: Negative.   Skin: Negative.   Neurological: Negative.   Psychiatric/Behavioral: Negative.       Objective:   Physical Exam  Constitutional: He is oriented to person, place, and time. He appears well-developed and well-nourished.  HENT:  Head: Normocephalic and atraumatic.  Eyes: EOM are normal.  Neck: Normal range of motion.  Cardiovascular: Normal rate and regular rhythm.   Carotids clear  Pulmonary/Chest: Effort normal and breath sounds normal. No respiratory distress. He has no wheezes. He has no rales.  Abdominal: Soft. Bowel sounds are normal. He exhibits no distension. There is no tenderness. There is no rebound.  Genitourinary: Prostate normal.  Musculoskeletal: He exhibits no edema.  Neurological: He is alert and oriented to person, place, and time. Coordination normal.  Skin: Skin is warm and dry.  Psychiatric: He has a normal mood and affect.   Vitals:   07/26/16 0902  BP: 140/82  Pulse: (!) 57  Resp: 12  Temp: 97.6 F (36.4 C)  TempSrc: Oral  SpO2: 97%  Weight: 186 lb (84.4 kg)  Height: 6' (1.829 m)      Assessment & Plan:

## 2016-07-26 NOTE — Assessment & Plan Note (Signed)
Previously intolerant to statins. Checking lipid panel and adjust as needed. Is exercising now.

## 2016-07-26 NOTE — Assessment & Plan Note (Addendum)
Colonoscopy up to date, given rx for shingrix today. Tetanus and flu up to date. Counseled on sun safety and alcohol recommendations. Given screening recommendations.

## 2016-07-26 NOTE — Patient Instructions (Signed)
We have given you the prescription for the shingles shot. This is 2 shots that you are given 2 months apart.   We are checking the labs today and will get back with you about the results.   Make sure to be good about keeping the head covered when out in the sun to protect your skin.    Health Maintenance, Male A healthy lifestyle and preventive care is important for your health and wellness. Ask your health care provider about what schedule of regular examinations is right for you. What should I know about weight and diet? Eat a Healthy Diet  Eat plenty of vegetables, fruits, whole grains, low-fat dairy products, and lean protein.  Do not eat a lot of foods high in solid fats, added sugars, or salt.  Maintain a Healthy Weight Regular exercise can help you achieve or maintain a healthy weight. You should:  Do at least 150 minutes of exercise each week. The exercise should increase your heart rate and make you sweat (moderate-intensity exercise).  Do strength-training exercises at least twice a week.  Watch Your Levels of Cholesterol and Blood Lipids  Have your blood tested for lipids and cholesterol every 5 years starting at 60 years of age. If you are at high risk for heart disease, you should start having your blood tested when you are 60 years old. You may need to have your cholesterol levels checked more often if: ? Your lipid or cholesterol levels are high. ? You are older than 60 years of age. ? You are at high risk for heart disease.  What should I know about cancer screening? Many types of cancers can be detected early and may often be prevented. Lung Cancer  You should be screened every year for lung cancer if: ? You are a current smoker who has smoked for at least 30 years. ? You are a former smoker who has quit within the past 15 years.  Talk to your health care provider about your screening options, when you should start screening, and how often you should be  screened.  Colorectal Cancer  Routine colorectal cancer screening usually begins at 60 years of age and should be repeated every 5-10 years until you are 60 years old. You may need to be screened more often if early forms of precancerous polyps or small growths are found. Your health care provider may recommend screening at an earlier age if you have risk factors for colon cancer.  Your health care provider may recommend using home test kits to check for hidden blood in the stool.  A small camera at the end of a tube can be used to examine your colon (sigmoidoscopy or colonoscopy). This checks for the earliest forms of colorectal cancer.  Prostate and Testicular Cancer  Depending on your age and overall health, your health care provider may do certain tests to screen for prostate and testicular cancer.  Talk to your health care provider about any symptoms or concerns you have about testicular or prostate cancer.  Skin Cancer  Check your skin from head to toe regularly.  Tell your health care provider about any new moles or changes in moles, especially if: ? There is a change in a mole's size, shape, or color. ? You have a mole that is larger than a pencil eraser.  Always use sunscreen. Apply sunscreen liberally and repeat throughout the day.  Protect yourself by wearing long sleeves, pants, a wide-brimmed hat, and sunglasses when outside.  What  should I know about heart disease, diabetes, and high blood pressure?  If you are 24-23 years of age, have your blood pressure checked every 3-5 years. If you are 43 years of age or older, have your blood pressure checked every year. You should have your blood pressure measured twice-once when you are at a hospital or clinic, and once when you are not at a hospital or clinic. Record the average of the two measurements. To check your blood pressure when you are not at a hospital or clinic, you can use: ? An automated blood pressure machine at a  pharmacy. ? A home blood pressure monitor.  Talk to your health care provider about your target blood pressure.  If you are between 31-73 years old, ask your health care provider if you should take aspirin to prevent heart disease.  Have regular diabetes screenings by checking your fasting blood sugar level. ? If you are at a normal weight and have a low risk for diabetes, have this test once every three years after the age of 28. ? If you are overweight and have a high risk for diabetes, consider being tested at a younger age or more often.  A one-time screening for abdominal aortic aneurysm (AAA) by ultrasound is recommended for men aged 17-75 years who are current or former smokers. What should I know about preventing infection? Hepatitis B If you have a higher risk for hepatitis B, you should be screened for this virus. Talk with your health care provider to find out if you are at risk for hepatitis B infection. Hepatitis C Blood testing is recommended for:  Everyone born from [birth year] through 1964.  Anyone with known risk factors for hepatitis C.  Sexually Transmitted Diseases (STDs)  You should be screened each year for STDs including gonorrhea and chlamydia if: ? You are sexually active and are younger than 60 years of age. ? You are older than 60 years of age and your health care provider tells you that you are at risk for this type of infection. ? Your sexual activity has changed since you were last screened and you are at an increased risk for chlamydia or gonorrhea. Ask your health care provider if you are at risk.  Talk with your health care provider about whether you are at high risk of being infected with HIV. Your health care provider may recommend a prescription medicine to help prevent HIV infection.  What else can I do?  Schedule regular health, dental, and eye exams.  Stay current with your vaccines (immunizations).  Do not use any tobacco products, such as  cigarettes, chewing tobacco, and e-cigarettes. If you need help quitting, ask your health care provider.  Limit alcohol intake to no more than 2 drinks per day. One drink equals 12 ounces of beer, 5 ounces of wine, or 1 ounces of hard liquor.  Do not use street drugs.  Do not share needles.  Ask your health care provider for help if you need support or information about quitting drugs.  Tell your health care provider if you often feel depressed.  Tell your health care provider if you have ever been abused or do not feel safe at home. This information is not intended to replace advice given to you by your health care provider. Make sure you discuss any questions you have with your health care provider. Document Released: 08/11/2007 Document Revised: 10/12/2015 Document Reviewed: 11/16/2014 Elsevier Interactive Patient Education  [employer].

## 2016-07-30 ENCOUNTER — Other Ambulatory Visit: Payer: Self-pay | Admitting: Internal Medicine

## 2016-07-30 DIAGNOSIS — M9904 Segmental and somatic dysfunction of sacral region: Secondary | ICD-10-CM | POA: Diagnosis not present

## 2016-07-30 DIAGNOSIS — M5136 Other intervertebral disc degeneration, lumbar region: Secondary | ICD-10-CM | POA: Diagnosis not present

## 2016-07-30 DIAGNOSIS — M5137 Other intervertebral disc degeneration, lumbosacral region: Secondary | ICD-10-CM | POA: Diagnosis not present

## 2016-07-30 DIAGNOSIS — M9903 Segmental and somatic dysfunction of lumbar region: Secondary | ICD-10-CM | POA: Diagnosis not present

## 2016-07-30 MED ORDER — DULOXETINE HCL 60 MG PO CPEP: 60.0000 mg | ORAL_CAPSULE | Freq: Every day | ORAL | 1 refills | Status: DC

## 2016-08-07 DIAGNOSIS — M5137 Other intervertebral disc degeneration, lumbosacral region: Secondary | ICD-10-CM | POA: Diagnosis not present

## 2016-08-07 DIAGNOSIS — M9903 Segmental and somatic dysfunction of lumbar region: Secondary | ICD-10-CM | POA: Diagnosis not present

## 2016-08-07 DIAGNOSIS — M9904 Segmental and somatic dysfunction of sacral region: Secondary | ICD-10-CM | POA: Diagnosis not present

## 2016-08-07 DIAGNOSIS — M5136 Other intervertebral disc degeneration, lumbar region: Secondary | ICD-10-CM | POA: Diagnosis not present

## 2016-08-09 DIAGNOSIS — M9903 Segmental and somatic dysfunction of lumbar region: Secondary | ICD-10-CM | POA: Diagnosis not present

## 2016-08-09 DIAGNOSIS — M5136 Other intervertebral disc degeneration, lumbar region: Secondary | ICD-10-CM | POA: Diagnosis not present

## 2016-08-09 DIAGNOSIS — M5137 Other intervertebral disc degeneration, lumbosacral region: Secondary | ICD-10-CM | POA: Diagnosis not present

## 2016-08-09 DIAGNOSIS — M9904 Segmental and somatic dysfunction of sacral region: Secondary | ICD-10-CM | POA: Diagnosis not present

## 2016-08-13 DIAGNOSIS — M9903 Segmental and somatic dysfunction of lumbar region: Secondary | ICD-10-CM | POA: Diagnosis not present

## 2016-08-13 DIAGNOSIS — M5137 Other intervertebral disc degeneration, lumbosacral region: Secondary | ICD-10-CM | POA: Diagnosis not present

## 2016-08-13 DIAGNOSIS — M5136 Other intervertebral disc degeneration, lumbar region: Secondary | ICD-10-CM | POA: Diagnosis not present

## 2016-08-13 DIAGNOSIS — M9904 Segmental and somatic dysfunction of sacral region: Secondary | ICD-10-CM | POA: Diagnosis not present

## 2016-08-16 DIAGNOSIS — M5136 Other intervertebral disc degeneration, lumbar region: Secondary | ICD-10-CM | POA: Diagnosis not present

## 2016-08-16 DIAGNOSIS — M9903 Segmental and somatic dysfunction of lumbar region: Secondary | ICD-10-CM | POA: Diagnosis not present

## 2016-08-16 DIAGNOSIS — M9904 Segmental and somatic dysfunction of sacral region: Secondary | ICD-10-CM | POA: Diagnosis not present

## 2016-08-16 DIAGNOSIS — M5137 Other intervertebral disc degeneration, lumbosacral region: Secondary | ICD-10-CM | POA: Diagnosis not present

## 2016-08-20 DIAGNOSIS — M5137 Other intervertebral disc degeneration, lumbosacral region: Secondary | ICD-10-CM | POA: Diagnosis not present

## 2016-08-20 DIAGNOSIS — M9904 Segmental and somatic dysfunction of sacral region: Secondary | ICD-10-CM | POA: Diagnosis not present

## 2016-08-20 DIAGNOSIS — M9903 Segmental and somatic dysfunction of lumbar region: Secondary | ICD-10-CM | POA: Diagnosis not present

## 2016-08-20 DIAGNOSIS — M5136 Other intervertebral disc degeneration, lumbar region: Secondary | ICD-10-CM | POA: Diagnosis not present

## 2016-08-23 DIAGNOSIS — M5137 Other intervertebral disc degeneration, lumbosacral region: Secondary | ICD-10-CM | POA: Diagnosis not present

## 2016-08-23 DIAGNOSIS — M5136 Other intervertebral disc degeneration, lumbar region: Secondary | ICD-10-CM | POA: Diagnosis not present

## 2016-08-23 DIAGNOSIS — M9903 Segmental and somatic dysfunction of lumbar region: Secondary | ICD-10-CM | POA: Diagnosis not present

## 2016-08-23 DIAGNOSIS — M9904 Segmental and somatic dysfunction of sacral region: Secondary | ICD-10-CM | POA: Diagnosis not present

## 2016-10-05 ENCOUNTER — Other Ambulatory Visit: Payer: Self-pay | Admitting: Internal Medicine

## 2016-11-13 ENCOUNTER — Other Ambulatory Visit (HOSPITAL_COMMUNITY): Payer: Self-pay | Admitting: Family Medicine

## 2016-11-13 DIAGNOSIS — M79662 Pain in left lower leg: Secondary | ICD-10-CM | POA: Diagnosis not present

## 2016-11-13 DIAGNOSIS — M7989 Other specified soft tissue disorders: Principal | ICD-10-CM

## 2016-11-13 DIAGNOSIS — S76302A Unspecified injury of muscle, fascia and tendon of the posterior muscle group at thigh level, left thigh, initial encounter: Secondary | ICD-10-CM | POA: Diagnosis not present

## 2016-11-13 DIAGNOSIS — M79605 Pain in left leg: Secondary | ICD-10-CM

## 2016-11-14 ENCOUNTER — Ambulatory Visit (HOSPITAL_COMMUNITY)
Admission: RE | Admit: 2016-11-14 | Discharge: 2016-11-14 | Disposition: A | Discharge status: Home / Self Care | Payer: BLUE CROSS/BLUE SHIELD | Source: Ambulatory Visit | Attending: Family Medicine | Admitting: Family Medicine

## 2016-11-14 ENCOUNTER — Encounter (HOSPITAL_COMMUNITY): Payer: Self-pay

## 2016-11-14 DIAGNOSIS — M79605 Pain in left leg: Secondary | ICD-10-CM

## 2016-11-14 DIAGNOSIS — M79662 Pain in left lower leg: Secondary | ICD-10-CM | POA: Diagnosis not present

## 2016-11-14 DIAGNOSIS — M7989 Other specified soft tissue disorders: Secondary | ICD-10-CM

## 2016-11-14 NOTE — Progress Notes (Signed)
VASCULAR LAB PRELIMINARY  PRELIMINARY  PRELIMINARY  PRELIMINARY  Left lower extremity venous duplex completed.    Preliminary report:  Left:  No evidence of DVT, superficial thrombosis, or Baker's cyst.  Bertil Brickey, RVS 11/14/2016, 11:27 AM

## 2017-01-28 ENCOUNTER — Other Ambulatory Visit: Payer: Self-pay | Admitting: Internal Medicine

## 2017-03-15 ENCOUNTER — Encounter: Payer: Self-pay | Admitting: Internal Medicine

## 2017-03-15 DIAGNOSIS — E785 Hyperlipidemia, unspecified: Secondary | ICD-10-CM

## 2017-03-22 ENCOUNTER — Other Ambulatory Visit (INDEPENDENT_AMBULATORY_CARE_PROVIDER_SITE_OTHER): Payer: Self-pay

## 2017-03-22 DIAGNOSIS — E785 Hyperlipidemia, unspecified: Secondary | ICD-10-CM

## 2017-03-22 LAB — LIPID PANEL
CHOL/HDL RATIO: 4
CHOLESTEROL: 245 mg/dL — AB (ref 0–200)
HDL: 58.7 mg/dL (ref >39.00)
LDL Cholesterol: 170 mg/dL — ABNORMAL HIGH (ref 0–99)
NonHDL: 185.89
TRIGLYCERIDES: 78 mg/dL (ref 0.0–149.0)
VLDL: 15.6 mg/dL (ref 0.0–40.0)

## 2017-05-07 ENCOUNTER — Encounter: Payer: Self-pay | Admitting: Internal Medicine

## 2017-05-13 ENCOUNTER — Encounter: Payer: Self-pay | Admitting: Internal Medicine

## 2017-05-22 ENCOUNTER — Ambulatory Visit: Payer: Self-pay | Admitting: Internal Medicine

## 2017-05-27 ENCOUNTER — Ambulatory Visit: Payer: Self-pay | Admitting: Internal Medicine

## 2017-06-04 ENCOUNTER — Encounter: Payer: Self-pay | Admitting: Gastroenterology

## 2017-06-04 ENCOUNTER — Encounter: Payer: Self-pay | Admitting: Internal Medicine

## 2017-06-04 MED ORDER — LORAZEPAM 0.5 MG PO TABS: 0.5000 mg | ORAL_TABLET | Freq: Every day | ORAL | 0 refills | Status: DC | PRN

## 2017-06-14 ENCOUNTER — Ambulatory Visit: Payer: BLUE CROSS/BLUE SHIELD | Admitting: Internal Medicine

## 2017-06-14 VITALS — BP 138/84 | HR 64 | Ht 72.0 in | Wt 184.0 lb

## 2017-06-14 DIAGNOSIS — R002 Palpitations: Secondary | ICD-10-CM | POA: Diagnosis not present

## 2017-06-14 DIAGNOSIS — I491 Atrial premature depolarization: Secondary | ICD-10-CM | POA: Diagnosis not present

## 2017-06-14 NOTE — Progress Notes (Signed)
The nurses meter Dr.     Patient Care Team: Myrlene Brokerrawford, Elizabeth A, MD as PCP - General (Internal Medicine)   HPI  Joshua [name] is a 62 y.o. male Seen In followup fo wide-complex rhythms associated with exercise which i have thought was nonsustained ventricular tachycardia with normal coronary arteries. He also had PACs.    History of wide-complex rhythms associated with exercise felt to be nonsustained VT (Versus SVT). He underwent cardiac cath in 2009 due to BP drop with exercise and 3 beats of VT- this showed no significant CAD.    Echocardiogram January 14 demonstrated normal left ventricular function with normal valvular function and size chamber sizes   He had interval atrial fibrillation detected on his [employer] monitor.  He had other palpitations unassociated atrial fibrillation but some degree of atrial irregularity consistent with previously identified PACs  His biggest concern relates to alcohol intake which he is concerned is excessive but is part of his lifestyle.  He is treated hypothyroidism. 7/17 Cr 0.9  K4.5  TSH 6.05 Synthroid 25 6/17     LDL 138       Past Medical History:  Diagnosis Date  . Abnormal patient-activated cardiac event monitor    a.  Event monitor 02/2012 showed correlation of palpitations with PACs, PVCs, and short atrial runs.  . Atrial bigeminy   . Hx of echocardiogram    Echo 1/14:  EF 55-60%, normal wall motion, Gr 1 diast dysfn  . Hyperlipidemia   . Hypothyroidism   . Irregular cardiac rhythm 2010   exercise induced WCT (SVT vs VT);  Echocardiogram 5/08: EF 55-60%, trivial MR, trivial TR. He had hypotension during ETT and LHC 8/09: No CAD, normal LV function.  Marland Kitchen. NSVT (nonsustained ventricular tachycardia) (HCC)   . PAT (paroxysmal atrial tachycardia) (HCC)   . Premature atrial contractions   . Prostatitis    PMH of, Dr Brunilda PayorNesi  . PVC's (premature ventricular contractions)     Past Surgical History:  Procedure Laterality Date  .  APPENDECTOMY    . BASAL CELL CARCINOMA EXCISION  2006   Dr Terri PiedraLupton  . CARDIAC CATHETERIZATION  2010   for dysrrhythmias  . COLONOSCOPY  2009   negative; [facility]    Current Outpatient Medications  Medication Sig Dispense Refill  . DULoxetine (CYMBALTA) 60 MG capsule TAKE 1 CAPSULE BY MOUTH ONCE DAILY 90 capsule 1  . levothyroxine (SYNTHROID, LEVOTHROID) 50 MCG tablet TAKE ONE TABLET BY MOUTH ONCE DAILY 90 tablet 2  . Levothyroxine Sodium 50 MCG CAPS Take 50 mcg by mouth daily before breakfast.     . LORazepam (ATIVAN) 0.5 MG tablet Take 1 tablet (0.5 mg total) by mouth daily as needed for anxiety. 30 tablet 0   No current facility-administered medications for this visit.     Allergies  Allergen Reactions  . Pravastatin Other (See Comments)    Generalized arthralgias; med D/Ced 4/13    Review of Systems negative except from HPI and PMH  Physical Exam BP 138/84   Pulse 64   Ht 6' (1.829 m)   Wt 184 lb (83.5 kg)   BMI 24.95 kg/m  Well developed and nourished in no acute distress HENT normal Neck supple with JVP-flat Clear Regular rate and rhythm, no murmurs or gallops Abd-soft with active BS No Clubbing cyanosis edema Skin-warm and dry A & Oriented  Grossly normal sensory and motor function   ECG demonstrates  64 17/08/40  Assessment and  Plan  Palpitations  Alcohol Intake excessive (per self)  Depression -situational   Elevated blood pressure  Blood pressures are borderline elevated.  This is recurring.  We discussed its relationship with alcohol.  We discussed the atrial fibrillation relation with alcohol.  He notes that the month he had his worst episode of atrial fibrillation he was on a very strict "cleansing" diet.  We looked at the data for alcohol and atrial fibrillation as well as the data for alcohol and mortality.  His depression related to his son's death is improved.  He now has anxiety and stresses related to work and money, things that he  notes are more normal.  We discussed a long time the issue of his alcohol is suggested that perhaps getting back up with his counselor who he saw in the wake of his son's death could be helpful.  Discussed the importance of not driving.  More than 50% of 45 min was spent in counseling related to the above

## 2017-06-14 NOTE — Patient Instructions (Addendum)
Medication Instructions:  Your physician recommends that you continue on your current medications as directed. Please refer to the Current Medication list given to you today.  Labwork: None ordered.  Testing/Procedures: None ordered.  Follow-Up: Your physician wants you to follow-up in: 12 months with Dr Klein You will receive a reminder letter in the mail two months in advance. If you don't receive a letter, please call our office to schedule the follow-up appointment.    Any Other Special Instructions Will Be Listed Below (If Applicable).  If you need a refill on your cardiac medications before your next appointment, please call your pharmacy.   

## 2017-07-02 ENCOUNTER — Other Ambulatory Visit: Payer: Self-pay | Admitting: Internal Medicine

## 2017-07-03 ENCOUNTER — Encounter: Payer: Self-pay | Admitting: Internal Medicine

## 2017-07-16 ENCOUNTER — Other Ambulatory Visit: Payer: Self-pay | Admitting: Internal Medicine

## 2017-07-19 ENCOUNTER — Telehealth: Payer: Self-pay | Admitting: Internal Medicine

## 2017-07-19 NOTE — Telephone Encounter (Signed)
Contacted [facility] pharmacy to see if prescription for Cymbalta was received.  Pharmacy states that when the prescription was sent on 5/21 it may have been too soon to have the medication refilled. [facility] pharmacy states that they can go ahead and refill the medication for the pt.

## 2017-07-19 NOTE — Telephone Encounter (Signed)
Copied from CRM [id number]. Topic: Inquiry >> Jul 19, 2017  9:32 AM Yvonna Alanis wrote: Reason for CRM: Patient called stating that he needs a refill of DULoxetine (CYMBALTA) 60 MG capsule today. Patient is leaving to go out of the country on tomorrow for two weeks. Patient's preferred pharmacy is [facility] [address] [place] - [address] [phone] (Phone)   [phone] (Fax).         Thank You!!!

## 2017-08-15 ENCOUNTER — Telehealth: Payer: Self-pay

## 2017-08-15 ENCOUNTER — Encounter: Payer: Self-pay | Admitting: Internal Medicine

## 2017-08-15 ENCOUNTER — Ambulatory Visit (INDEPENDENT_AMBULATORY_CARE_PROVIDER_SITE_OTHER): Payer: BLUE CROSS/BLUE SHIELD | Admitting: Internal Medicine

## 2017-08-15 ENCOUNTER — Other Ambulatory Visit (INDEPENDENT_AMBULATORY_CARE_PROVIDER_SITE_OTHER): Payer: BLUE CROSS/BLUE SHIELD

## 2017-08-15 VITALS — BP 122/88 | HR 54 | Temp 97.6°F | Ht 72.0 in | Wt 183.0 lb

## 2017-08-15 DIAGNOSIS — E785 Hyperlipidemia, unspecified: Secondary | ICD-10-CM | POA: Diagnosis not present

## 2017-08-15 DIAGNOSIS — F411 Generalized anxiety disorder: Secondary | ICD-10-CM | POA: Diagnosis not present

## 2017-08-15 DIAGNOSIS — Z0000 Encounter for general adult medical examination without abnormal findings: Secondary | ICD-10-CM

## 2017-08-15 DIAGNOSIS — Z23 Encounter for immunization: Secondary | ICD-10-CM | POA: Diagnosis not present

## 2017-08-15 DIAGNOSIS — E039 Hypothyroidism, unspecified: Secondary | ICD-10-CM

## 2017-08-15 DIAGNOSIS — Z1211 Encounter for screening for malignant neoplasm of colon: Secondary | ICD-10-CM | POA: Diagnosis not present

## 2017-08-15 LAB — LIPID PANEL
CHOL/HDL RATIO: 4
Cholesterol: 291 mg/dL — ABNORMAL HIGH (ref 0–200)
HDL: 75.8 mg/dL (ref >39.00)
LDL CALC: 200 mg/dL — AB (ref 0–99)
NONHDL: 215.67
Triglycerides: 76 mg/dL (ref 0.0–149.0)
VLDL: 15.2 mg/dL (ref 0.0–40.0)

## 2017-08-15 LAB — CBC
HEMATOCRIT: 41.9 % (ref 39.0–52.0)
Hemoglobin: 14.5 g/dL (ref 13.0–17.0)
MCHC: 34.7 g/dL (ref 30.0–36.0)
MCV: 98.6 fl (ref 78.0–100.0)
PLATELETS: 261 10*3/uL (ref 150.0–400.0)
RBC: 4.25 Mil/uL (ref 4.22–5.81)
RDW: 12.6 % (ref 11.5–15.5)
WBC: 3.4 10*3/uL — ABNORMAL LOW (ref 4.0–10.5)

## 2017-08-15 LAB — COMPREHENSIVE METABOLIC PANEL
ALT: 21 U/L (ref 0–53)
AST: 21 U/L (ref 0–37)
Albumin: 4.5 g/dL (ref 3.5–5.2)
Alkaline Phosphatase: 72 U/L (ref 39–117)
BUN: 16 mg/dL (ref 6–23)
CHLORIDE: 100 meq/L (ref 96–112)
CO2: 29 meq/L (ref 19–32)
CREATININE: 0.96 mg/dL (ref 0.40–1.50)
Calcium: 9.5 mg/dL (ref 8.4–10.5)
GFR: 84.76 mL/min (ref >60.00)
GLUCOSE: 94 mg/dL (ref 70–99)
Potassium: 4.4 mEq/L (ref 3.5–5.1)
Sodium: 137 mEq/L (ref 135–145)
Total Bilirubin: 0.7 mg/dL (ref 0.2–1.2)
Total Protein: 7.6 g/dL (ref 6.0–8.3)

## 2017-08-15 LAB — T4, FREE: Free T4: 0.79 ng/dL (ref 0.60–1.60)

## 2017-08-15 LAB — TSH: TSH: 4.31 u[IU]/mL (ref 0.35–4.50)

## 2017-08-15 NOTE — Assessment & Plan Note (Signed)
Taking cymbalta 60 mg daily with good results. Uses ativan rarely for anxiety or flying. Advised about the risk of long term usage with ativan.

## 2017-08-15 NOTE — Telephone Encounter (Signed)
cologuard Order [id number]

## 2017-08-15 NOTE — Patient Instructions (Signed)
We have ordered the cologuard for colon cancer screening.  We have added you to our shingles waiting list.   Health Maintenance, Male A healthy lifestyle and preventive care is important for your health and wellness. Ask your health care provider about what schedule of regular examinations is right for you. What should I know about weight and diet? Eat a Healthy Diet  Eat plenty of vegetables, fruits, whole grains, low-fat dairy products, and lean protein.  Do not eat a lot of foods high in solid fats, added sugars, or salt.  Maintain a Healthy Weight Regular exercise can help you achieve or maintain a healthy weight. You should:  Do at least 150 minutes of exercise each week. The exercise should increase your heart rate and make you sweat (moderate-intensity exercise).  Do strength-training exercises at least twice a week.  Watch Your Levels of Cholesterol and Blood Lipids  Have your blood tested for lipids and cholesterol every 5 years starting at 61 years of age. If you are at high risk for heart disease, you should start having your blood tested when you are 61 years old. You may need to have your cholesterol levels checked more often if: ? Your lipid or cholesterol levels are high. ? You are older than 61 years of age. ? You are at high risk for heart disease.  What should I know about cancer screening? Many types of cancers can be detected early and may often be prevented. Lung Cancer  You should be screened every year for lung cancer if: ? You are a current smoker who has smoked for at least 30 years. ? You are a former smoker who has quit within the past 15 years.  Talk to your health care provider about your screening options, when you should start screening, and how often you should be screened.  Colorectal Cancer  Routine colorectal cancer screening usually begins at 61 years of age and should be repeated every 5-10 years until you are 61 years old. You may need to  be screened more often if early forms of precancerous polyps or small growths are found. Your health care provider may recommend screening at an earlier age if you have risk factors for colon cancer.  Your health care provider may recommend using home test kits to check for hidden blood in the stool.  A small camera at the end of a tube can be used to examine your colon (sigmoidoscopy or colonoscopy). This checks for the earliest forms of colorectal cancer.  Prostate and Testicular Cancer  Depending on your age and overall health, your health care provider may do certain tests to screen for prostate and testicular cancer.  Talk to your health care provider about any symptoms or concerns you have about testicular or prostate cancer.  Skin Cancer  Check your skin from head to toe regularly.  Tell your health care provider about any new moles or changes in moles, especially if: ? There is a change in a mole's size, shape, or color. ? You have a mole that is larger than a pencil eraser.  Always use sunscreen. Apply sunscreen liberally and repeat throughout the day.  Protect yourself by wearing long sleeves, pants, a wide-brimmed hat, and sunglasses when outside.  What should I know about heart disease, diabetes, and high blood pressure?  If you are 318-61 years of age, have your blood pressure checked every 3-5 years. If you are 61 years of age or older, have your blood pressure checked  every year. You should have your blood pressure measured twice-once when you are at a hospital or clinic, and once when you are not at a hospital or clinic. Record the average of the two measurements. To check your blood pressure when you are not at a hospital or clinic, you can use: ? An automated blood pressure machine at a pharmacy. ? A home blood pressure monitor.  Talk to your health care provider about your target blood pressure.  If you are between 93-77 years old, ask your health care provider if  you should take aspirin to prevent heart disease.  Have regular diabetes screenings by checking your fasting blood sugar level. ? If you are at a normal weight and have a low risk for diabetes, have this test once every three years after the age of 43. ? If you are overweight and have a high risk for diabetes, consider being tested at a younger age or more often.  A one-time screening for abdominal aortic aneurysm (AAA) by ultrasound is recommended for men aged 40-75 years who are current or former smokers. What should I know about preventing infection? Hepatitis B If you have a higher risk for hepatitis B, you should be screened for this virus. Talk with your health care provider to find out if you are at risk for hepatitis B infection. Hepatitis C Blood testing is recommended for:  Everyone born from [birth year] through 1964.  Anyone with known risk factors for hepatitis C.  Sexually Transmitted Diseases (STDs)  You should be screened each year for STDs including gonorrhea and chlamydia if: ? You are sexually active and are younger than 61 years of age. ? You are older than 61 years of age and your health care provider tells you that you are at risk for this type of infection. ? Your sexual activity has changed since you were last screened and you are at an increased risk for chlamydia or gonorrhea. Ask your health care provider if you are at risk.  Talk with your health care provider about whether you are at high risk of being infected with HIV. Your health care provider may recommend a prescription medicine to help prevent HIV infection.  What else can I do?  Schedule regular health, dental, and eye exams.  Stay current with your vaccines (immunizations).  Do not use any tobacco products, such as cigarettes, chewing tobacco, and e-cigarettes. If you need help quitting, ask your health care provider.  Limit alcohol intake to no more than 2 drinks per day. One drink equals 12 ounces of  beer, 5 ounces of wine, or 1 ounces of hard liquor.  Do not use street drugs.  Do not share needles.  Ask your health care provider for help if you need support or information about quitting drugs.  Tell your health care provider if you often feel depressed.  Tell your health care provider if you have ever been abused or do not feel safe at home. This information is not intended to replace advice given to you by your health care provider. Make sure you discuss any questions you have with your health care provider. Document Released: 08/11/2007 Document Revised: 10/12/2015 Document Reviewed: 11/16/2014 Elsevier Interactive Patient Education  [employer].

## 2017-08-15 NOTE — Progress Notes (Signed)
   Subjective:    Patient ID: Joshua [name] E Blacketer, male    DOB: [birth year], 62 y.o.   MRN: [id number]  HPI The patient is a 62 YO man coming in for physical. No new concerns.   [facility], [facility], social history reviewed and updated.   Review of Systems  Constitutional: Negative.   HENT: Negative.   Eyes: Negative.   Respiratory: Negative for cough, chest tightness and shortness of breath.   Cardiovascular: Negative for chest pain, palpitations and leg swelling.  Gastrointestinal: Negative for abdominal distention, abdominal pain, constipation, diarrhea, nausea and vomiting.  Musculoskeletal: Negative.   Skin: Negative.   Neurological: Negative.   Psychiatric/Behavioral: Negative.       Objective:   Physical Exam  Constitutional: He is oriented to person, place, and time. He appears well-developed and well-nourished.  HENT:  Head: Normocephalic and atraumatic.  Eyes: EOM are normal.  Neck: Normal range of motion.  Cardiovascular: Normal rate and regular rhythm.  Pulmonary/Chest: Effort normal and breath sounds normal. No respiratory distress. He has no wheezes. He has no rales.  Abdominal: Soft. Bowel sounds are normal. He exhibits no distension. There is no tenderness. There is no rebound.  Musculoskeletal: He exhibits no edema.  Neurological: He is alert and oriented to person, place, and time. Coordination normal.  Skin: Skin is warm and dry.  Psychiatric: He has a normal mood and affect.   Vitals:   08/15/17 1022  BP: 122/88  Pulse: (!) 54  Temp: 97.6 F (36.4 C)  TempSrc: Oral  SpO2: 98%  Weight: 183 lb (83 kg)  Height: 6' (1.829 m)      Assessment & Plan:  Tdap given at visit

## 2017-08-15 NOTE — Assessment & Plan Note (Signed)
Checking TSH and free T4 and adjust synthroid 50 mcg daily as needed.  

## 2017-08-15 NOTE — Assessment & Plan Note (Signed)
Flu shot yearly. Pneumonia not indicated. Shingrix added to waiting list. Tetanus given at visit. Cologuard ordered at visit. Counseled about sun safety and mole surveillance. Counseled about the dangers of distracted driving. Given 10 year screening recommendations.

## 2017-08-15 NOTE — Assessment & Plan Note (Signed)
Has discussed about statins with cardiology and elects to not take at this time. Checking lipid panel and adjust as needed.

## 2017-08-25 LAB — COLOGUARD: COLOGUARD: NEGATIVE

## 2017-09-03 DIAGNOSIS — H6123 Impacted cerumen, bilateral: Secondary | ICD-10-CM | POA: Diagnosis not present

## 2017-09-03 DIAGNOSIS — Z7289 Other problems related to lifestyle: Secondary | ICD-10-CM | POA: Diagnosis not present

## 2017-09-04 DIAGNOSIS — M9906 Segmental and somatic dysfunction of lower extremity: Secondary | ICD-10-CM | POA: Diagnosis not present

## 2017-09-04 DIAGNOSIS — M9904 Segmental and somatic dysfunction of sacral region: Secondary | ICD-10-CM | POA: Diagnosis not present

## 2017-09-04 DIAGNOSIS — M25551 Pain in right hip: Secondary | ICD-10-CM | POA: Diagnosis not present

## 2017-09-04 DIAGNOSIS — M9903 Segmental and somatic dysfunction of lumbar region: Secondary | ICD-10-CM | POA: Diagnosis not present

## 2017-09-04 LAB — COLOGUARD: Cologuard: NEGATIVE

## 2017-09-10 ENCOUNTER — Other Ambulatory Visit: Payer: Self-pay | Admitting: Internal Medicine

## 2017-09-11 DIAGNOSIS — M9903 Segmental and somatic dysfunction of lumbar region: Secondary | ICD-10-CM | POA: Diagnosis not present

## 2017-09-11 DIAGNOSIS — M9904 Segmental and somatic dysfunction of sacral region: Secondary | ICD-10-CM | POA: Diagnosis not present

## 2017-09-11 DIAGNOSIS — M25551 Pain in right hip: Secondary | ICD-10-CM | POA: Diagnosis not present

## 2017-09-11 DIAGNOSIS — M9906 Segmental and somatic dysfunction of lower extremity: Secondary | ICD-10-CM | POA: Diagnosis not present

## 2017-09-16 DIAGNOSIS — M9903 Segmental and somatic dysfunction of lumbar region: Secondary | ICD-10-CM | POA: Diagnosis not present

## 2017-09-16 DIAGNOSIS — M9904 Segmental and somatic dysfunction of sacral region: Secondary | ICD-10-CM | POA: Diagnosis not present

## 2017-09-16 DIAGNOSIS — M9906 Segmental and somatic dysfunction of lower extremity: Secondary | ICD-10-CM | POA: Diagnosis not present

## 2017-09-16 DIAGNOSIS — M25551 Pain in right hip: Secondary | ICD-10-CM | POA: Diagnosis not present

## 2017-09-17 ENCOUNTER — Other Ambulatory Visit: Payer: Self-pay | Admitting: Internal Medicine

## 2017-09-17 NOTE — Telephone Encounter (Signed)
Control database checked last refill: 06/04/2017 LOV: 08/15/2017

## 2017-09-18 ENCOUNTER — Encounter: Payer: Self-pay | Admitting: Internal Medicine

## 2017-09-29 ENCOUNTER — Encounter: Payer: Self-pay | Admitting: Internal Medicine

## 2017-10-01 ENCOUNTER — Encounter: Payer: Self-pay | Admitting: Internal Medicine

## 2017-10-01 ENCOUNTER — Other Ambulatory Visit (INDEPENDENT_AMBULATORY_CARE_PROVIDER_SITE_OTHER): Payer: BLUE CROSS/BLUE SHIELD

## 2017-10-01 ENCOUNTER — Ambulatory Visit: Payer: BLUE CROSS/BLUE SHIELD | Admitting: Internal Medicine

## 2017-10-01 VITALS — BP 108/70 | HR 57 | Temp 97.7°F | Ht 72.0 in | Wt 191.0 lb

## 2017-10-01 DIAGNOSIS — R9389 Abnormal findings on diagnostic imaging of other specified body structures: Secondary | ICD-10-CM

## 2017-10-01 LAB — PSA: PSA: 0.55 ng/mL (ref 0.10–4.00)

## 2017-10-01 NOTE — Patient Instructions (Signed)
We will check the ultrasound of the bladder and PSA.

## 2017-10-01 NOTE — Assessment & Plan Note (Addendum)
Checking US bladder and PSA to rule out any problems. He has had indication of mass or something in the crotch area 6 times at airport security.

## 2017-10-01 NOTE — Progress Notes (Signed)
   Subjective:    Patient ID: Joshua [name], male    DOB: 1955/07/06, 62 y.o.   MRN: [id number]  HPI The patient is a 62 YO man coming in for concerns about prostate. He has had false positive screening in the crotch area at airports 6 times. He is worried that he could have a prostate problem. No recent PSA. No symptoms such as problems urinating, urinating often, starting and stopping. Previously has seen urology with PSA .38 2011. Denies any known injury to the area.   Review of Systems  Constitutional: Negative.   HENT: Negative.   Eyes: Negative.   Respiratory: Negative for cough, chest tightness and shortness of breath.   Cardiovascular: Negative for chest pain, palpitations and leg swelling.  Gastrointestinal: Negative for abdominal distention, abdominal pain, constipation, diarrhea, nausea and vomiting.  Musculoskeletal: Negative.   Skin: Negative.   Neurological: Negative.   Psychiatric/Behavioral: Negative.       Objective:   Physical Exam  Constitutional: He is oriented to person, place, and time. He appears well-developed and well-nourished.  HENT:  Head: Normocephalic and atraumatic.  Eyes: EOM are normal.  Neck: Normal range of motion.  Cardiovascular: Normal rate and regular rhythm.  Pulmonary/Chest: Effort normal and breath sounds normal. No respiratory distress. He has no wheezes. He has no rales.  Abdominal: Soft. Bowel sounds are normal. He exhibits no distension. There is no tenderness. There is no rebound.  Musculoskeletal: He exhibits no edema.  Neurological: He is alert and oriented to person, place, and time. Coordination normal.  Skin: Skin is warm and dry.  Psychiatric: He has a normal mood and affect.   Vitals:   10/01/17 0939  BP: 108/70  Pulse: (!) 57  Temp: 97.7 F (36.5 C)  TempSrc: Oral  SpO2: 98%  Weight: 191 lb (86.6 kg)  Height: 6' (1.829 m)      Assessment & Plan:

## 2017-10-05 ENCOUNTER — Encounter: Payer: Self-pay | Admitting: Internal Medicine

## 2017-10-11 ENCOUNTER — Other Ambulatory Visit: Payer: Self-pay | Admitting: Internal Medicine

## 2017-10-11 DIAGNOSIS — R1900 Intra-abdominal and pelvic swelling, mass and lump, unspecified site: Secondary | ICD-10-CM

## 2017-10-21 ENCOUNTER — Encounter: Payer: Self-pay | Admitting: Internal Medicine

## 2017-10-22 ENCOUNTER — Other Ambulatory Visit: Payer: Self-pay | Admitting: Internal Medicine

## 2017-10-23 DIAGNOSIS — M9904 Segmental and somatic dysfunction of sacral region: Secondary | ICD-10-CM | POA: Diagnosis not present

## 2017-10-23 DIAGNOSIS — M25551 Pain in right hip: Secondary | ICD-10-CM | POA: Diagnosis not present

## 2017-10-23 DIAGNOSIS — M9906 Segmental and somatic dysfunction of lower extremity: Secondary | ICD-10-CM | POA: Diagnosis not present

## 2017-10-23 DIAGNOSIS — M9903 Segmental and somatic dysfunction of lumbar region: Secondary | ICD-10-CM | POA: Diagnosis not present

## 2017-10-24 ENCOUNTER — Ambulatory Visit
Admission: RE | Admit: 2017-10-24 | Discharge: 2017-10-24 | Disposition: A | Discharge status: Home / Self Care | Payer: BLUE CROSS/BLUE SHIELD | Source: Ambulatory Visit | Attending: Internal Medicine | Admitting: Internal Medicine

## 2017-10-24 DIAGNOSIS — N429 Disorder of prostate, unspecified: Secondary | ICD-10-CM | POA: Diagnosis not present

## 2017-10-24 DIAGNOSIS — R1900 Intra-abdominal and pelvic swelling, mass and lump, unspecified site: Secondary | ICD-10-CM

## 2017-11-18 DIAGNOSIS — M25551 Pain in right hip: Secondary | ICD-10-CM | POA: Diagnosis not present

## 2017-12-13 ENCOUNTER — Other Ambulatory Visit: Payer: Self-pay | Admitting: Internal Medicine

## 2017-12-13 MED ORDER — LORAZEPAM 0.5 MG PO TABS: 0.5000 mg | ORAL_TABLET | Freq: Every day | ORAL | 1 refills | Status: DC | PRN

## 2017-12-13 NOTE — Telephone Encounter (Signed)
Check [place] registry last filled 09/17/2017.Marland KitchenRaechel Chute

## 2017-12-13 NOTE — Telephone Encounter (Signed)
MD approved and sent electronically to pof../lmb  

## 2018-01-02 ENCOUNTER — Encounter: Payer: Self-pay | Admitting: Internal Medicine

## 2018-05-02 DIAGNOSIS — R238 Other skin changes: Secondary | ICD-10-CM | POA: Diagnosis not present

## 2018-05-02 DIAGNOSIS — L988 Other specified disorders of the skin and subcutaneous tissue: Secondary | ICD-10-CM | POA: Diagnosis not present

## 2018-05-02 DIAGNOSIS — L570 Actinic keratosis: Secondary | ICD-10-CM | POA: Diagnosis not present

## 2018-05-02 DIAGNOSIS — L821 Other seborrheic keratosis: Secondary | ICD-10-CM | POA: Diagnosis not present

## 2018-05-02 DIAGNOSIS — C44311 Basal cell carcinoma of skin of nose: Secondary | ICD-10-CM | POA: Diagnosis not present

## 2018-05-02 DIAGNOSIS — D225 Melanocytic nevi of trunk: Secondary | ICD-10-CM | POA: Diagnosis not present

## 2018-05-02 DIAGNOSIS — D1801 Hemangioma of skin and subcutaneous tissue: Secondary | ICD-10-CM | POA: Diagnosis not present

## 2018-05-02 DIAGNOSIS — L814 Other melanin hyperpigmentation: Secondary | ICD-10-CM | POA: Diagnosis not present

## 2018-06-30 ENCOUNTER — Telehealth: Payer: Self-pay | Admitting: Internal Medicine

## 2018-06-30 NOTE — Telephone Encounter (Signed)
New message ° ° °LMOM for pt to call and schedule virtual visit with Dr. Klein-recall.  °

## 2018-07-04 NOTE — Telephone Encounter (Signed)
I called and left patient a message about setting up a virtual office visit since he is on our recall list.

## 2018-07-14 NOTE — Telephone Encounter (Signed)
Patient called back, he is scheduled for 10/16/18; patient did not want telehealth visit.

## 2018-07-14 NOTE — Telephone Encounter (Signed)
I called and left patient a message about setting up telehealth visit since he is on our recall list.

## 2018-07-22 ENCOUNTER — Other Ambulatory Visit: Payer: Self-pay | Admitting: Internal Medicine

## 2018-08-01 ENCOUNTER — Encounter: Payer: Self-pay | Admitting: Internal Medicine

## 2018-08-01 MED ORDER — LORAZEPAM 0.5 MG PO TABS: 0.5000 mg | ORAL_TABLET | Freq: Every day | ORAL | 0 refills | Status: DC | PRN

## 2018-08-18 ENCOUNTER — Telehealth: Payer: Self-pay

## 2018-08-18 NOTE — Telephone Encounter (Signed)
LVM for patient to call back to be screened and give check in number

## 2018-08-19 ENCOUNTER — Other Ambulatory Visit: Payer: Self-pay

## 2018-08-19 ENCOUNTER — Encounter: Payer: Self-pay | Admitting: Internal Medicine

## 2018-08-19 ENCOUNTER — Other Ambulatory Visit (INDEPENDENT_AMBULATORY_CARE_PROVIDER_SITE_OTHER): Payer: BC Managed Care – PPO

## 2018-08-19 ENCOUNTER — Ambulatory Visit (INDEPENDENT_AMBULATORY_CARE_PROVIDER_SITE_OTHER): Payer: BC Managed Care – PPO | Admitting: Internal Medicine

## 2018-08-19 VITALS — BP 118/80 | HR 53 | Temp 97.6°F | Ht 72.0 in | Wt 190.0 lb

## 2018-08-19 DIAGNOSIS — Z0000 Encounter for general adult medical examination without abnormal findings: Secondary | ICD-10-CM

## 2018-08-19 DIAGNOSIS — F411 Generalized anxiety disorder: Secondary | ICD-10-CM

## 2018-08-19 DIAGNOSIS — E039 Hypothyroidism, unspecified: Secondary | ICD-10-CM | POA: Diagnosis not present

## 2018-08-19 DIAGNOSIS — E785 Hyperlipidemia, unspecified: Secondary | ICD-10-CM | POA: Diagnosis not present

## 2018-08-19 LAB — COMPREHENSIVE METABOLIC PANEL
ALT: 24 U/L (ref 0–53)
AST: 28 U/L (ref 0–37)
Albumin: 4.3 g/dL (ref 3.5–5.2)
Alkaline Phosphatase: 59 U/L (ref 39–117)
BUN: 19 mg/dL (ref 6–23)
CO2: 29 mEq/L (ref 19–32)
Calcium: 9.1 mg/dL (ref 8.4–10.5)
Chloride: 99 mEq/L (ref 96–112)
Creatinine, Ser: 0.96 mg/dL (ref 0.40–1.50)
GFR: 79.48 mL/min (ref >60.00)
Glucose, Bld: 97 mg/dL (ref 70–99)
Potassium: 4.2 mEq/L (ref 3.5–5.1)
Sodium: 135 mEq/L (ref 135–145)
Total Bilirubin: 0.6 mg/dL (ref 0.2–1.2)
Total Protein: 6.7 g/dL (ref 6.0–8.3)

## 2018-08-19 LAB — LIPID PANEL
Cholesterol: 228 mg/dL — ABNORMAL HIGH (ref 0–200)
HDL: 73.9 mg/dL (ref >39.00)
LDL Cholesterol: 139 mg/dL — ABNORMAL HIGH (ref 0–99)
NonHDL: 154.09
Total CHOL/HDL Ratio: 3
Triglycerides: 74 mg/dL (ref 0.0–149.0)
VLDL: 14.8 mg/dL (ref 0.0–40.0)

## 2018-08-19 LAB — CBC
HCT: 39.6 % (ref 39.0–52.0)
Hemoglobin: 13.5 g/dL (ref 13.0–17.0)
MCHC: 34.1 g/dL (ref 30.0–36.0)
MCV: 99.7 fl (ref 78.0–100.0)
Platelets: 243 10*3/uL (ref 150.0–400.0)
RBC: 3.97 Mil/uL — ABNORMAL LOW (ref 4.22–5.81)
RDW: 13 % (ref 11.5–15.5)
WBC: 3.9 10*3/uL — ABNORMAL LOW (ref 4.0–10.5)

## 2018-08-19 LAB — TSH: TSH: 5.79 u[IU]/mL — ABNORMAL HIGH (ref 0.35–4.50)

## 2018-08-19 LAB — T4, FREE: Free T4: 0.85 ng/dL (ref 0.60–1.60)

## 2018-08-19 NOTE — Progress Notes (Signed)
   Subjective:   Patient ID: Joshua [name], male    DOB: [birth year], 63 y.o.   MRN: [id number]  HPI The patient is a 63 YO man coming in for physical.   PMH, [place], social history reviewed and updated  Review of Systems  Constitutional: Negative.   HENT: Negative.   Eyes: Negative.   Respiratory: Negative for cough, chest tightness and shortness of breath.   Cardiovascular: Negative for chest pain, palpitations and leg swelling.  Gastrointestinal: Negative for abdominal distention, abdominal pain, constipation, diarrhea, nausea and vomiting.  Musculoskeletal: Negative.   Skin: Negative.   Neurological: Negative.   Psychiatric/Behavioral: Negative.     Objective:  Physical Exam Constitutional:      Appearance: He is well-developed.  HENT:     Head: Normocephalic and atraumatic.  Neck:     Musculoskeletal: Normal range of motion.  Cardiovascular:     Rate and Rhythm: Normal rate and regular rhythm.  Pulmonary:     Effort: Pulmonary effort is normal. No respiratory distress.     Breath sounds: Normal breath sounds. No wheezing or rales.  Abdominal:     General: Bowel sounds are normal. There is no distension.     Palpations: Abdomen is soft.     Tenderness: There is no abdominal tenderness. There is no rebound.  Skin:    General: Skin is warm and dry.  Neurological:     Mental Status: He is alert and oriented to person, place, and time.     Coordination: Coordination normal.     Vitals:   08/19/18 0857  BP: 118/80  Pulse: (!) 53  Temp: 97.6 F (36.4 C)  TempSrc: Oral  SpO2: 98%  Weight: 190 lb (86.2 kg)  Height: 6' (1.829 m)    Assessment & Plan:

## 2018-08-19 NOTE — Assessment & Plan Note (Signed)
Checking TSH and free T4 and adjust synthroid 50 mcg daily as needed.  

## 2018-08-19 NOTE — Assessment & Plan Note (Signed)
Flu shot counesled. Shingrix counseled. Tetanus up to date. Cologuard up to date. Counseled about sun safety and mole surveillance. Counseled about the dangers of distracted driving. Given 10 year screening recommendations.

## 2018-08-19 NOTE — Progress Notes (Signed)
Abstracted and sent to scan  

## 2018-08-19 NOTE — Assessment & Plan Note (Signed)
Checking labs and he is taking red yeast rice daily now.

## 2018-08-19 NOTE — Patient Instructions (Signed)

## 2018-08-19 NOTE — Assessment & Plan Note (Signed)
Taking cymbalta with good relief. New grandson (1st grandchild) which is bringing him much joy. Uses ativan rarely. Counseled about risk and benefit and he agrees to continue.

## 2018-09-09 ENCOUNTER — Encounter: Payer: Self-pay | Admitting: Internal Medicine

## 2018-09-12 ENCOUNTER — Encounter: Payer: Self-pay | Admitting: Internal Medicine

## 2018-09-12 MED ORDER — LEVOTHYROXINE SODIUM 50 MCG PO TABS: 50.0000 ug | ORAL_TABLET | Freq: Every day | ORAL | 3 refills | Status: DC

## 2018-09-14 ENCOUNTER — Other Ambulatory Visit: Payer: Self-pay | Admitting: Internal Medicine

## 2018-09-21 DIAGNOSIS — Z1159 Encounter for screening for other viral diseases: Secondary | ICD-10-CM | POA: Diagnosis not present

## 2018-10-14 IMAGING — US US PELVIS LIMITED
1 series · 14 of 17 positions shown · non-contrast
Comparison: None.

CLINICAL DATA: Abnormal airport scan with possible prostate
abnormality

EXAM:
ULTRASOUND OF THE MALE PELVIS

[Series 1: us pelvis limited · 0.23mm/px · 14 of 17 slices shown]
[im 1/17]
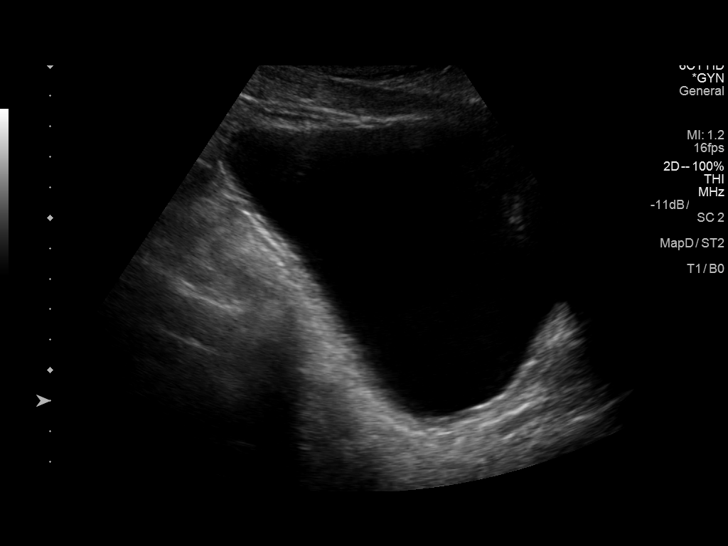
[im 2/17]
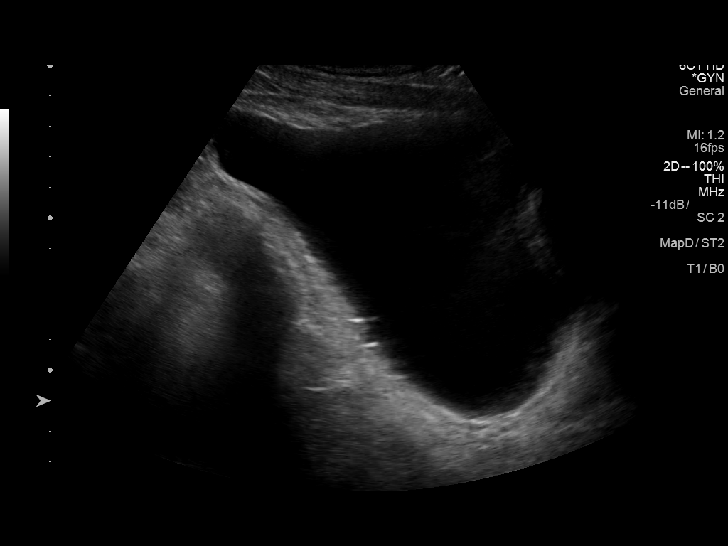
[im 4/17]
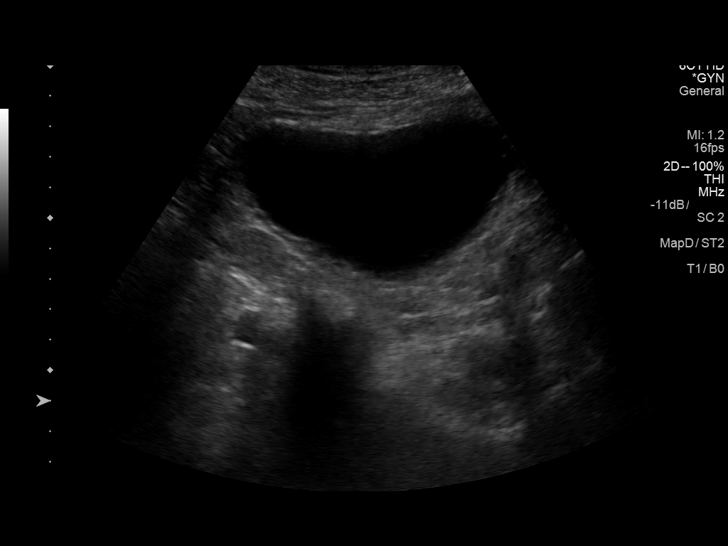
[im 5/17]
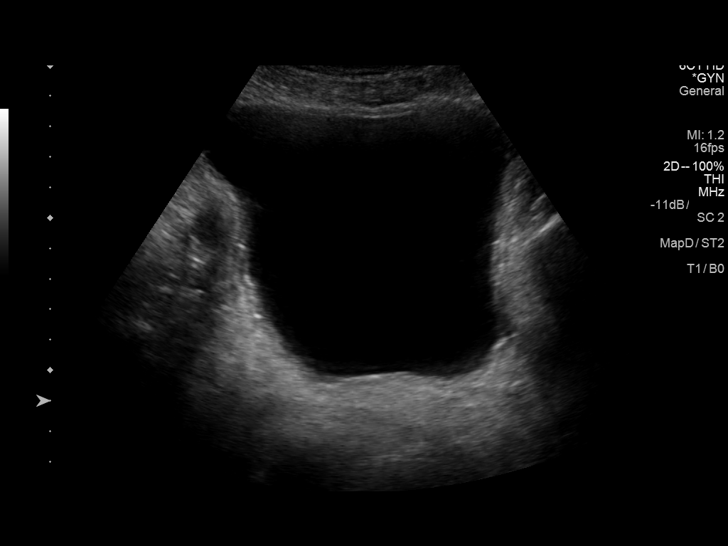
[im 6/17]
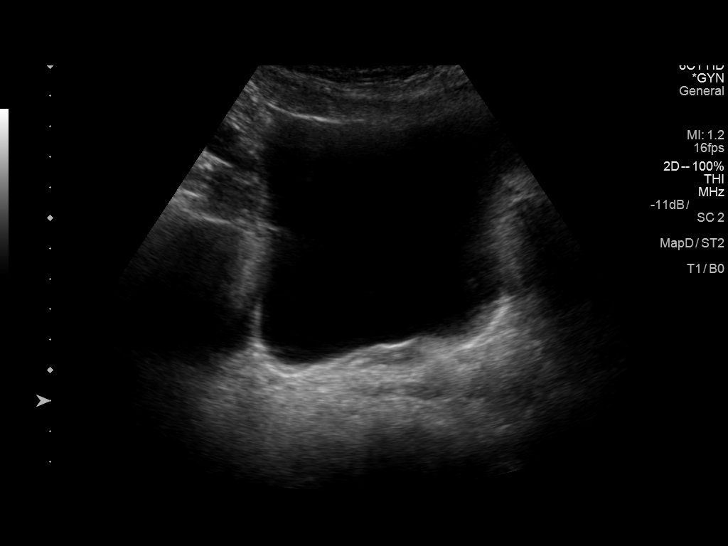
[im 7/17]
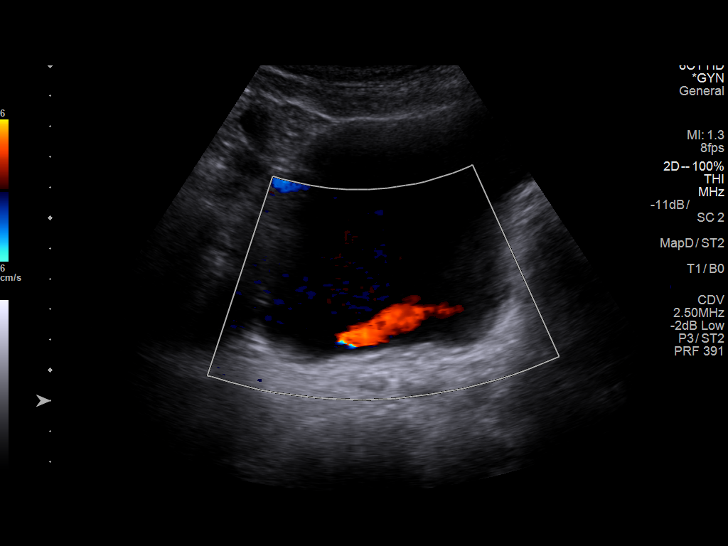
[im 8/17]
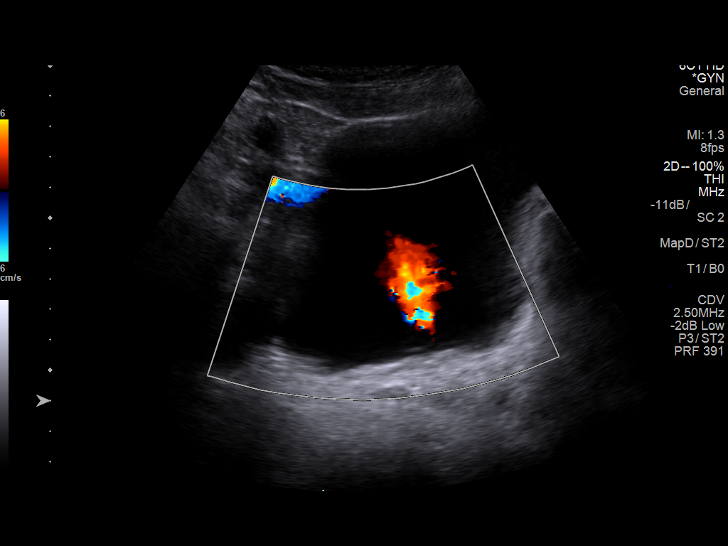
[im 10/17]
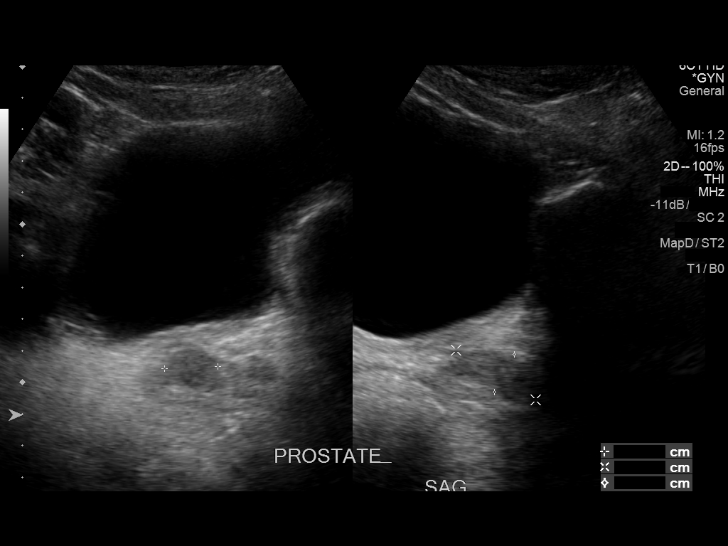
[im 11/17]
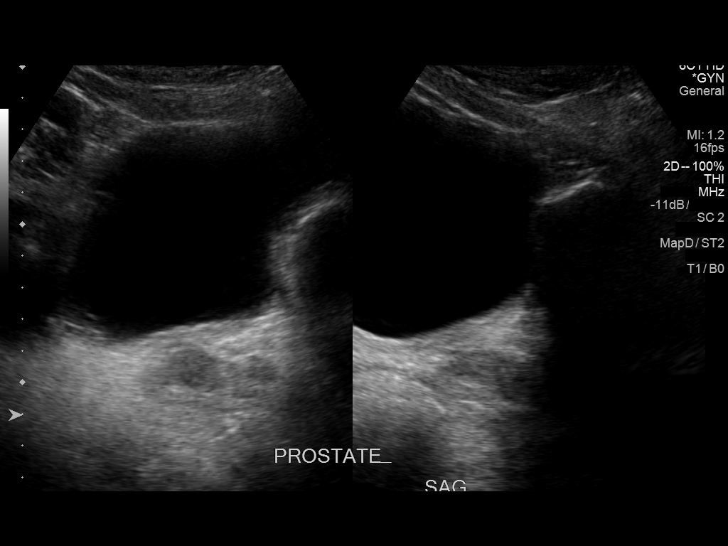
[im 12/17]
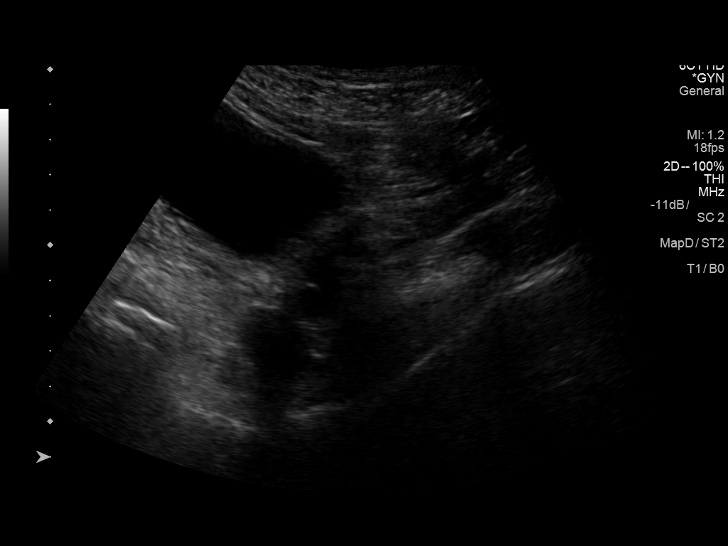
[im 13/17]
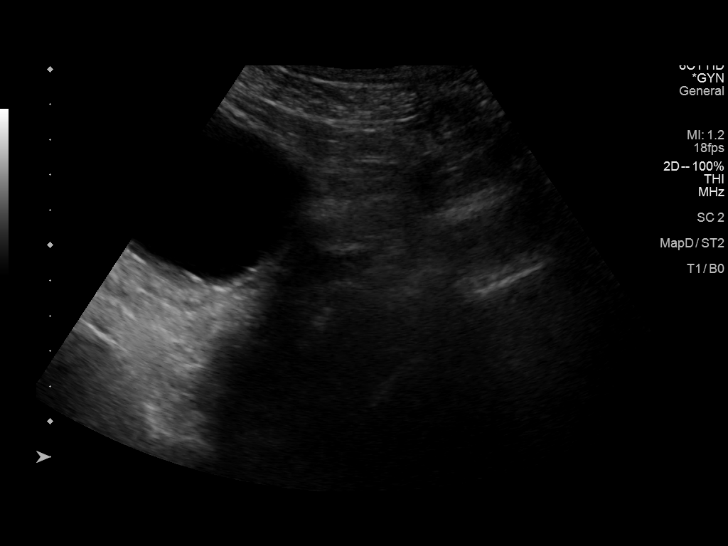
[im 14/17]
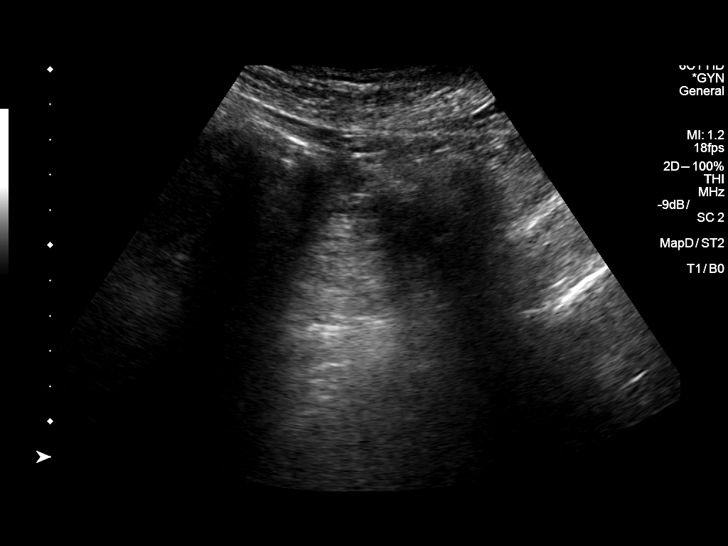
[im 16/17]
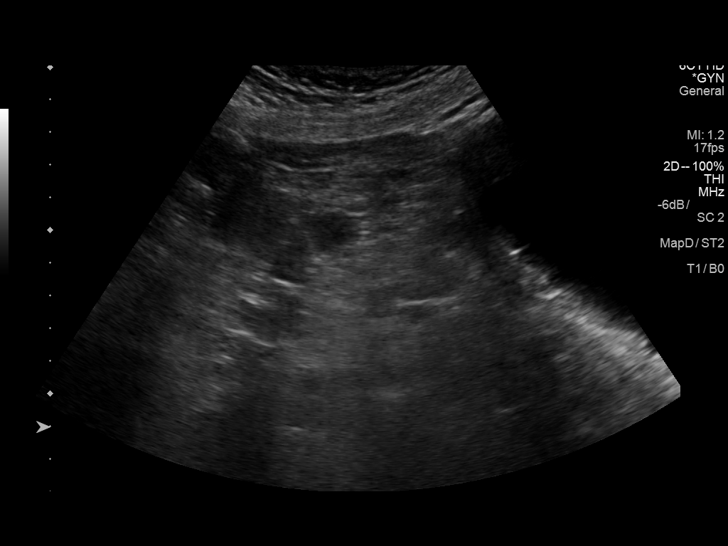
[im 17/17]
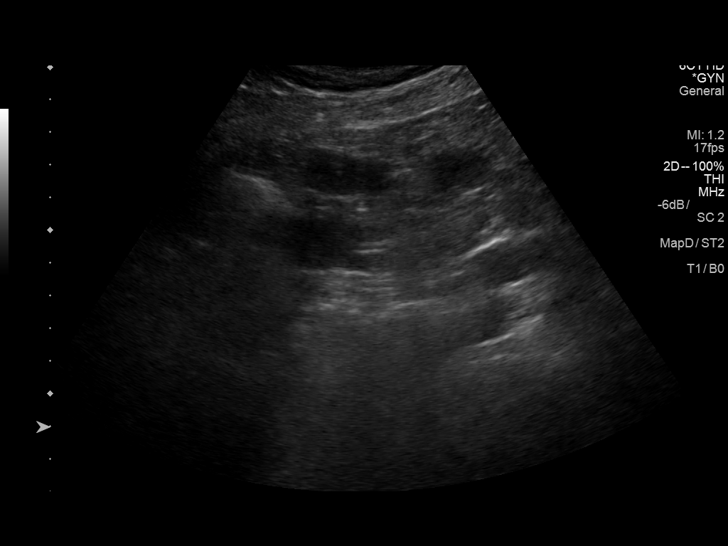

[14 of 17 positions shown; findings below may reference images not displayed]

FINDINGS: Bladder is partially distended without focal abnormality. The
prostate is well visualized and within normal limits. No focal mass
lesion is identified to correspond with the patient's clinical
history.
IMPRESSION: No acute abnormality noted.

## 2018-10-16 ENCOUNTER — Ambulatory Visit: Payer: BLUE CROSS/BLUE SHIELD | Admitting: Internal Medicine

## 2018-10-27 ENCOUNTER — Other Ambulatory Visit: Payer: Self-pay

## 2018-10-27 DIAGNOSIS — R6889 Other general symptoms and signs: Secondary | ICD-10-CM | POA: Diagnosis not present

## 2018-10-27 DIAGNOSIS — Z20822 Contact with and (suspected) exposure to covid-19: Secondary | ICD-10-CM

## 2018-10-29 LAB — NOVEL CORONAVIRUS, NAA: SARS-CoV-2, NAA: NOT DETECTED

## 2018-11-04 DIAGNOSIS — C44311 Basal cell carcinoma of skin of nose: Secondary | ICD-10-CM | POA: Diagnosis not present

## 2018-11-09 ENCOUNTER — Other Ambulatory Visit: Payer: Self-pay | Admitting: Internal Medicine

## 2018-11-11 MED ORDER — LORAZEPAM 0.5 MG PO TABS: 0.5000 mg | ORAL_TABLET | Freq: Every day | ORAL | 0 refills | Status: DC | PRN

## 2018-11-11 NOTE — Telephone Encounter (Signed)
Control database checked last refill: 08/01/2018 30 tabs LOV: 08/19/2018 cpe NOV: none

## 2018-12-09 ENCOUNTER — Encounter: Payer: Self-pay | Admitting: Internal Medicine

## 2018-12-09 ENCOUNTER — Other Ambulatory Visit: Payer: Self-pay

## 2018-12-09 ENCOUNTER — Ambulatory Visit: Payer: BC Managed Care – PPO | Admitting: Internal Medicine

## 2018-12-09 VITALS — BP 142/82 | HR 76 | Ht 72.0 in | Wt 189.4 lb

## 2018-12-09 DIAGNOSIS — I471 Supraventricular tachycardia: Secondary | ICD-10-CM | POA: Diagnosis not present

## 2018-12-09 DIAGNOSIS — R002 Palpitations: Secondary | ICD-10-CM | POA: Diagnosis not present

## 2018-12-09 NOTE — Progress Notes (Signed)
r.     Patient Care Team: [name], MD as PCP - General (Internal Medicine)   HPI  Joshua [name] is a 63 y.o. male Seen In followup fo wide-complex rhythms associated with exercise which i have thought was nonsustained ventricular tachycardia with normal coronary arteries. He also had PACs.    History of wide-complex rhythms associated with exercise felt to be nonsustained VT (Versus SVT). He underwent cardiac cath in 2009 due to BP drop with exercise and 3 beats of VT- this showed no significant CAD.    Echocardiogram January 14 demonstrated normal left ventricular function with normal valvular function and size chamber sizes   He had interval atrial fibrillation detected on his [employer] monitor.  He had other palpitations unassociated atrial fibrillation but some degree of atrial irregularity consistent with previously identified PACs  Palpitations are better and clearly linked to stress and alcohol  He comes intoday having ridden his bike and shows me also a picture of his 40 month old grandson Joshua [name]-- beaming -- renewing .  He is treated hypothyroidism. 7/17 Cr 0.9  K4.5  TSH 6.05 Synthroid 25 6/17     LDL 138      Past Medical History:  Diagnosis Date  . Abnormal patient-activated cardiac event monitor    a.  Event monitor 02/2012 showed correlation of palpitations with PACs, PVCs, and short atrial runs.  . Atrial bigeminy   . Hx of echocardiogram    Echo 1/14:  EF 55-60%, normal wall motion, Gr 1 diast dysfn  . Hyperlipidemia   . Hypothyroidism   . Irregular cardiac rhythm 2010   exercise induced WCT (SVT vs VT);  Echocardiogram 5/08: EF 55-60%, trivial MR, trivial TR. He had hypotension during ETT and LHC 8/09: No CAD, normal LV function.  Marland Kitchen NSVT (nonsustained ventricular tachycardia) ([place])   . PAT (paroxysmal atrial tachycardia) ([place])   . Premature atrial contractions   . Prostatitis    PMH of, Dr Janice Norrie  . PVC's (premature ventricular contractions)      Past Surgical History:  Procedure Laterality Date  . APPENDECTOMY    . BASAL CELL CARCINOMA EXCISION  2006   Dr Allyson Sabal  . CARDIAC CATHETERIZATION  2010   for dysrrhythmias  . COLONOSCOPY  2009   negative; [facility]    Current Outpatient Medications  Medication Sig Dispense Refill  . DULoxetine (CYMBALTA) 60 MG capsule Take 1 capsule by mouth once daily 90 capsule 1  . levothyroxine (SYNTHROID) 25 MCG tablet Take 25 mcg by mouth daily before breakfast.    . LORazepam (ATIVAN) 0.5 MG tablet Take 1 tablet (0.5 mg total) by mouth daily as needed for anxiety. 30 tablet 0  . Red Yeast Rice 600 MG CAPS Take 1 capsule by mouth 2 (two) times daily.     No current facility-administered medications for this visit.     Allergies  Allergen Reactions  . Pravastatin Other (See Comments)    Generalized arthralgias; med D/Ced 4/13    Review of Systems negative except from HPI and PMH  Physical Exam BP (!) 142/82   Pulse 76   Ht 6' (1.829 m)   Wt 189 lb 6.4 oz (85.9 kg)   SpO2 96%   BMI 25.69 kg/m  Well developed and nourished in no acute distress HENT normal Neck supple with JVP-  flat \ Clear Regular rate and rhythm, no murmurs or gallops Abd-soft with active BS No Clubbing cyanosis edema Skin-warm and dry A & Oriented  Grossly normal sensory and motor function     ECG  Sinus @ 76 16/08/38  Assessment and  Plan  Palpitations  Alcohol Intake excessive (per self)  Depression -situational   Elevated blood pressure  BP reasonable  [place] drinking but less; not driving   Aggravates palps and he is working on it  Now with grandson, his depression much better

## 2018-12-09 NOTE — Patient Instructions (Signed)
Medication Instructions:  Your physician recommends that you continue on your current medications as directed. Please refer to the Current Medication list given to you today.  Labwork: None ordered.  Testing/Procedures: None ordered.  Follow-Up: Your physician wants you to follow-up in: one year with Dr. Klein.   You will receive a reminder letter in the mail two months in advance. If you don't receive a letter, please call our office to schedule the follow-up appointment.  Any Other Special Instructions Will Be Listed Below (If Applicable).  If you need a refill on your cardiac medications before your next appointment, please call your pharmacy.   

## 2018-12-17 ENCOUNTER — Other Ambulatory Visit: Payer: Self-pay

## 2018-12-17 DIAGNOSIS — L570 Actinic keratosis: Secondary | ICD-10-CM | POA: Diagnosis not present

## 2018-12-17 DIAGNOSIS — Z20822 Contact with and (suspected) exposure to covid-19: Secondary | ICD-10-CM

## 2018-12-17 DIAGNOSIS — Z85828 Personal history of other malignant neoplasm of skin: Secondary | ICD-10-CM | POA: Diagnosis not present

## 2018-12-17 DIAGNOSIS — D485 Neoplasm of uncertain behavior of skin: Secondary | ICD-10-CM | POA: Diagnosis not present

## 2018-12-17 DIAGNOSIS — Z20828 Contact with and (suspected) exposure to other viral communicable diseases: Secondary | ICD-10-CM | POA: Diagnosis not present

## 2018-12-18 LAB — NOVEL CORONAVIRUS, NAA: SARS-CoV-2, NAA: NOT DETECTED

## 2019-01-19 ENCOUNTER — Ambulatory Visit: Payer: BC Managed Care – PPO

## 2019-01-20 ENCOUNTER — Other Ambulatory Visit: Payer: Self-pay

## 2019-01-20 DIAGNOSIS — Z20822 Contact with and (suspected) exposure to covid-19: Secondary | ICD-10-CM

## 2019-01-22 LAB — NOVEL CORONAVIRUS, NAA: SARS-CoV-2, NAA: NOT DETECTED

## 2019-01-28 ENCOUNTER — Ambulatory Visit (INDEPENDENT_AMBULATORY_CARE_PROVIDER_SITE_OTHER): Payer: BC Managed Care – PPO | Admitting: *Deleted

## 2019-01-28 ENCOUNTER — Other Ambulatory Visit: Payer: Self-pay

## 2019-01-28 DIAGNOSIS — Z23 Encounter for immunization: Secondary | ICD-10-CM

## 2019-03-17 ENCOUNTER — Other Ambulatory Visit: Payer: Self-pay | Admitting: Internal Medicine

## 2019-03-19 ENCOUNTER — Other Ambulatory Visit: Payer: Self-pay | Admitting: Internal Medicine

## 2019-03-20 NOTE — Telephone Encounter (Signed)
Filled 11/11/2018 Lorazepam 0.5 Mg Tablet 30#  Last ov 08/19/18 Next ov n/s

## 2019-03-30 NOTE — Telephone Encounter (Signed)
Filled 03/23/2019 Lorazepam 0.5 Mg Tablet 30#  Last ov 08/19/18 Next ov 04/01/19

## 2019-04-01 ENCOUNTER — Ambulatory Visit: Payer: BC Managed Care – PPO

## 2019-04-08 ENCOUNTER — Ambulatory Visit: Payer: BC Managed Care – PPO

## 2019-04-08 ENCOUNTER — Other Ambulatory Visit: Payer: Self-pay

## 2019-04-19 ENCOUNTER — Other Ambulatory Visit: Payer: Self-pay | Admitting: Internal Medicine

## 2019-04-29 ENCOUNTER — Ambulatory Visit: Payer: BC Managed Care – PPO

## 2019-05-05 DIAGNOSIS — Z23 Encounter for immunization: Secondary | ICD-10-CM | POA: Diagnosis not present

## 2019-06-29 ENCOUNTER — Other Ambulatory Visit: Payer: Self-pay | Admitting: Internal Medicine

## 2019-06-30 ENCOUNTER — Encounter: Payer: Self-pay | Admitting: Internal Medicine

## 2019-06-30 MED ORDER — LORAZEPAM 0.5 MG PO TABS: ORAL_TABLET | ORAL | 0 refills | Status: DC

## 2019-06-30 NOTE — Telephone Encounter (Signed)
MD is out of the office until Thursday. Check [place] registry last filled 03/23/2019 pls advise.Marland KitchenRaechel Chute

## 2019-06-30 NOTE — Telephone Encounter (Signed)
Check [place] registry last filled 03/23/2019. Md is out of the office until Thursday pls advise on refill.Marland KitchenRaechel Chute

## 2019-09-15 ENCOUNTER — Encounter: Payer: Self-pay | Admitting: Internal Medicine

## 2019-09-15 MED ORDER — LEVOTHYROXINE SODIUM 25 MCG PO TABS: 25.0000 ug | ORAL_TABLET | Freq: Every day | ORAL | 0 refills | Status: DC

## 2019-09-21 ENCOUNTER — Encounter: Payer: Self-pay | Admitting: Internal Medicine

## 2019-09-21 ENCOUNTER — Ambulatory Visit (INDEPENDENT_AMBULATORY_CARE_PROVIDER_SITE_OTHER): Payer: BC Managed Care – PPO | Admitting: Internal Medicine

## 2019-09-21 ENCOUNTER — Other Ambulatory Visit: Payer: Self-pay

## 2019-09-21 VITALS — BP 136/84 | HR 63 | Temp 98.0°F | Ht 72.0 in | Wt 188.0 lb

## 2019-09-21 DIAGNOSIS — E039 Hypothyroidism, unspecified: Secondary | ICD-10-CM | POA: Diagnosis not present

## 2019-09-21 DIAGNOSIS — Z0000 Encounter for general adult medical examination without abnormal findings: Secondary | ICD-10-CM

## 2019-09-21 DIAGNOSIS — E785 Hyperlipidemia, unspecified: Secondary | ICD-10-CM

## 2019-09-21 DIAGNOSIS — F411 Generalized anxiety disorder: Secondary | ICD-10-CM

## 2019-09-21 DIAGNOSIS — Z23 Encounter for immunization: Secondary | ICD-10-CM | POA: Diagnosis not present

## 2019-09-21 MED ORDER — LEVOTHYROXINE SODIUM 25 MCG PO TABS: 25.0000 ug | ORAL_TABLET | Freq: Every day | ORAL | 3 refills | Status: AC

## 2019-09-21 MED ORDER — LORAZEPAM 0.5 MG PO TABS: ORAL_TABLET | ORAL | 5 refills | Status: DC

## 2019-09-21 MED ORDER — DULOXETINE HCL 60 MG PO CPEP: 60.0000 mg | ORAL_CAPSULE | Freq: Every day | ORAL | 3 refills | Status: DC

## 2019-09-21 NOTE — Progress Notes (Signed)
   Subjective:   Patient ID: Joshua [name], male    DOB: [birth year], 64 y.o.   MRN: [id number]  HPI The patient is a 64 YO man coming in for physical. Moved to [place] and much happier with lifestyle there.   PMH, [facility], social history reviewed and updated.   Review of Systems  Constitutional: Negative.   HENT: Negative.   Eyes: Negative.   Respiratory: Negative for cough, chest tightness and shortness of breath.   Cardiovascular: Negative for chest pain, palpitations and leg swelling.  Gastrointestinal: Negative for abdominal distention, abdominal pain, constipation, diarrhea, nausea and vomiting.  Musculoskeletal: Negative.   Skin: Negative.   Neurological: Negative.   Psychiatric/Behavioral: Negative.     Objective:  Physical Exam Constitutional:      Appearance: He is well-developed.  HENT:     Head: Normocephalic and atraumatic.  Cardiovascular:     Rate and Rhythm: Normal rate and regular rhythm.  Pulmonary:     Effort: Pulmonary effort is normal. No respiratory distress.     Breath sounds: Normal breath sounds. No wheezing or rales.  Abdominal:     General: Bowel sounds are normal. There is no distension.     Palpations: Abdomen is soft.     Tenderness: There is no abdominal tenderness. There is no rebound.  Musculoskeletal:     Cervical back: Normal range of motion.  Skin:    General: Skin is warm and dry.  Neurological:     Mental Status: He is alert and oriented to person, place, and time.     Coordination: Coordination normal.     Vitals:   09/21/19 1310  BP: (!) 136/84  Pulse: 63  Temp: 98 F (36.7 C)  TempSrc: Oral  SpO2: 98%  Weight: 188 lb (85.3 kg)  Height: 6' (1.829 m)    This visit occurred during the SARS-CoV-2 public health emergency.  Safety protocols were in place, including screening questions prior to the visit, additional usage of staff PPE, and extensive cleaning of exam room while observing appropriate contact time as indicated  for disinfecting solutions.   Assessment & Plan:  Shingrix IM given at visit

## 2019-09-21 NOTE — Patient Instructions (Signed)
Cologuard or colonoscopy due next year. Second (and last) shingles vaccine done today.   Health Maintenance, Male Adopting a healthy lifestyle and getting preventive care are important in promoting health and wellness. Ask your health care provider about:  The right schedule for you to have regular tests and exams.  Things you can do on your own to prevent diseases and keep yourself healthy. What should I know about diet, weight, and exercise? Eat a healthy diet   Eat a diet that includes plenty of vegetables, fruits, low-fat dairy products, and lean protein.  Do not eat a lot of foods that are high in solid fats, added sugars, or sodium. Maintain a healthy weight Body mass index (BMI) is a measurement that can be used to identify possible weight problems. It estimates body fat based on height and weight. Your health care provider can help determine your BMI and help you achieve or maintain a healthy weight. Get regular exercise Get regular exercise. This is one of the most important things you can do for your health. Most adults should:  Exercise for at least 150 minutes each week. The exercise should increase your heart rate and make you sweat (moderate-intensity exercise).  Do strengthening exercises at least twice a week. This is in addition to the moderate-intensity exercise.  Spend less time sitting. Even light physical activity can be beneficial. Watch cholesterol and blood lipids Have your blood tested for lipids and cholesterol at 63 years of age, then have this test every 5 years. You may need to have your cholesterol levels checked more often if:  Your lipid or cholesterol levels are high.  You are older than 63 years of age.  You are at high risk for heart disease. What should I know about cancer screening? Many types of cancers can be detected early and may often be prevented. Depending on your health history and family history, you may need to have cancer screening  at various ages. This may include screening for:  Colorectal cancer.  Prostate cancer.  Skin cancer.  Lung cancer. What should I know about heart disease, diabetes, and high blood pressure? Blood pressure and heart disease  High blood pressure causes heart disease and increases the risk of stroke. This is more likely to develop in people who have high blood pressure readings, are of African descent, or are overweight.  Talk with your health care provider about your target blood pressure readings.  Have your blood pressure checked: ? Every 3-5 years if you are 55-87 years of age. ? Every year if you are 28 years old or older.  If you are between the ages of 78 and 20 and are a current or former smoker, ask your health care provider if you should have a one-time screening for abdominal aortic aneurysm (AAA). Diabetes Have regular diabetes screenings. This checks your fasting blood sugar level. Have the screening done:  Once every three years after age 64 if you are at a normal weight and have a low risk for diabetes.  More often and at a younger age if you are overweight or have a high risk for diabetes. What should I know about preventing infection? Hepatitis B If you have a higher risk for hepatitis B, you should be screened for this virus. Talk with your health care provider to find out if you are at risk for hepatitis B infection. Hepatitis C Blood testing is recommended for:  Everyone born from [birth year] through 1964.  Anyone with known risk  factors for hepatitis C. Sexually transmitted infections (STIs)  You should be screened each year for STIs, including gonorrhea and chlamydia, if: ? You are sexually active and are younger than 63 years of age. ? You are older than 63 years of age and your health care provider tells you that you are at risk for this type of infection. ? Your sexual activity has changed since you were last screened, and you are at increased risk for  chlamydia or gonorrhea. Ask your health care provider if you are at risk.  Ask your health care provider about whether you are at high risk for HIV. Your health care provider may recommend a prescription medicine to help prevent HIV infection. If you choose to take medicine to prevent HIV, you should first get tested for HIV. You should then be tested every 3 months for as long as you are taking the medicine. Follow these instructions at home: Lifestyle  Do not use any products that contain nicotine or tobacco, such as cigarettes, e-cigarettes, and chewing tobacco. If you need help quitting, ask your health care provider.  Do not use street drugs.  Do not share needles.  Ask your health care provider for help if you need support or information about quitting drugs. Alcohol use  Do not drink alcohol if your health care provider tells you not to drink.  If you drink alcohol: ? Limit how much you have to 0-2 drinks a day. ? Be aware of how much alcohol is in your drink. In the [place] one drink equals one 12 oz bottle of beer (355 mL), one 5 oz glass of wine (148 mL), or one 1 oz glass of hard liquor (44 mL). General instructions  Schedule regular health, dental, and eye exams.  Stay current with your vaccines.  Tell your health care provider if: ? You often feel depressed. ? You have ever been abused or do not feel safe at home. Summary  Adopting a healthy lifestyle and getting preventive care are important in promoting health and wellness.  Follow your health care provider's instructions about healthy diet, exercising, and getting tested or screened for diseases.  Follow your health care provider's instructions on monitoring your cholesterol and blood pressure. This information is not intended to replace advice given to you by your health care provider. Make sure you discuss any questions you have with your health care provider. Document Revised: 02/05/2018 Document Reviewed:  02/05/2018 Elsevier Patient Education  2020 [employer].

## 2019-09-22 ENCOUNTER — Other Ambulatory Visit: Payer: BC Managed Care – PPO

## 2019-09-22 DIAGNOSIS — E785 Hyperlipidemia, unspecified: Secondary | ICD-10-CM

## 2019-09-22 DIAGNOSIS — E039 Hypothyroidism, unspecified: Secondary | ICD-10-CM

## 2019-09-22 LAB — COMPREHENSIVE METABOLIC PANEL
AG Ratio: 1.8 (calc) (ref 1.0–2.5)
ALT: 17 U/L (ref 9–46)
AST: 22 U/L (ref 10–35)
Albumin: 4.3 g/dL (ref 3.6–5.1)
Alkaline phosphatase (APISO): 66 U/L (ref 35–144)
BUN: 11 mg/dL (ref 7–25)
CO2: 28 mmol/L (ref 20–32)
Calcium: 9.2 mg/dL (ref 8.6–10.3)
Chloride: 101 mmol/L (ref 98–110)
Creat: 0.82 mg/dL (ref 0.70–1.25)
Globulin: 2.4 g/dL (calc) (ref 1.9–3.7)
Glucose, Bld: 92 mg/dL (ref 65–99)
Potassium: 4.3 mmol/L (ref 3.5–5.3)
Sodium: 137 mmol/L (ref 135–146)
Total Bilirubin: 0.4 mg/dL (ref 0.2–1.2)
Total Protein: 6.7 g/dL (ref 6.1–8.1)

## 2019-09-22 LAB — LIPID PANEL
Cholesterol: 202 mg/dL — ABNORMAL HIGH (ref <200)
HDL: 76 mg/dL (ref >=40)
LDL Cholesterol (Calc): 108 mg/dL (calc) — ABNORMAL HIGH
Non-HDL Cholesterol (Calc): 126 mg/dL (calc) (ref <130)
Total CHOL/HDL Ratio: 2.7 (calc) (ref <5.0)
Triglycerides: 86 mg/dL (ref <150)

## 2019-09-22 LAB — CBC
HCT: 40.1 % (ref 38.5–50.0)
Hemoglobin: 13.7 g/dL (ref 13.2–17.1)
MCH: 33.6 pg — ABNORMAL HIGH (ref 27.0–33.0)
MCHC: 34.2 g/dL (ref 32.0–36.0)
MCV: 98.3 fL (ref 80.0–100.0)
MPV: 9.6 fL (ref 7.5–12.5)
Platelets: 258 10*3/uL (ref 140–400)
RBC: 4.08 10*6/uL — ABNORMAL LOW (ref 4.20–5.80)
RDW: 12.5 % (ref 11.0–15.0)
WBC: 6.6 10*3/uL (ref 3.8–10.8)

## 2019-09-22 LAB — PSA: PSA: 0.3 ng/mL (ref <=4.0)

## 2019-09-22 LAB — TSH: TSH: 5.22 mIU/L — ABNORMAL HIGH (ref 0.40–4.50)

## 2019-09-22 NOTE — Assessment & Plan Note (Signed)
Checking lipid panel and has been on red yeast rice for about 1 year presently.

## 2019-09-22 NOTE — Assessment & Plan Note (Signed)
Flu shot yearly, covid-19 up to date. Shingrix given 2nd to complete series today. Tetanus up to date. Cologuard or colonoscopy due 2022 discussed with patient. Counseled about sun safety and mole surveillance. Counseled about the dangers of distracted driving. Given 10 year screening recommendations.

## 2019-09-22 NOTE — Assessment & Plan Note (Signed)
Checking TSH and adjust synthroid 25 mcg daily as needed.  ?

## 2019-09-22 NOTE — Assessment & Plan Note (Signed)
Refill cymbalta and ativan. Doing well overall and much happier in new lifestyle.

## 2019-12-28 DIAGNOSIS — Z20822 Contact with and (suspected) exposure to covid-19: Secondary | ICD-10-CM | POA: Diagnosis not present

## 2019-12-30 DIAGNOSIS — Z20822 Contact with and (suspected) exposure to covid-19: Secondary | ICD-10-CM | POA: Diagnosis not present

## 2020-01-07 DIAGNOSIS — M1611 Unilateral primary osteoarthritis, right hip: Secondary | ICD-10-CM | POA: Diagnosis not present

## 2020-01-20 ENCOUNTER — Encounter: Payer: Self-pay | Admitting: Internal Medicine

## 2020-01-26 DIAGNOSIS — M25551 Pain in right hip: Secondary | ICD-10-CM | POA: Diagnosis not present

## 2020-01-28 ENCOUNTER — Encounter: Payer: Self-pay | Admitting: Internal Medicine

## 2020-01-29 ENCOUNTER — Ambulatory Visit (INDEPENDENT_AMBULATORY_CARE_PROVIDER_SITE_OTHER): Payer: BC Managed Care – PPO | Admitting: Internal Medicine

## 2020-01-29 ENCOUNTER — Other Ambulatory Visit: Payer: Self-pay

## 2020-01-29 ENCOUNTER — Encounter: Payer: Self-pay | Admitting: Internal Medicine

## 2020-01-29 VITALS — BP 168/90 | HR 72 | Temp 98.3°F | Wt 192.0 lb

## 2020-01-29 DIAGNOSIS — Z01818 Encounter for other preprocedural examination: Secondary | ICD-10-CM | POA: Diagnosis not present

## 2020-01-29 DIAGNOSIS — H5213 Myopia, bilateral: Secondary | ICD-10-CM | POA: Diagnosis not present

## 2020-01-29 DIAGNOSIS — H524 Presbyopia: Secondary | ICD-10-CM | POA: Diagnosis not present

## 2020-01-29 DIAGNOSIS — H52223 Regular astigmatism, bilateral: Secondary | ICD-10-CM | POA: Diagnosis not present

## 2020-01-29 DIAGNOSIS — Z135 Encounter for screening for eye and ear disorders: Secondary | ICD-10-CM | POA: Diagnosis not present

## 2020-01-29 NOTE — Patient Instructions (Addendum)
You are fine to have the surgery in March.

## 2020-01-29 NOTE — Assessment & Plan Note (Signed)
EKG done and without changes. BP mildly elevated likely related to lack of exercise recently and hip pain. Previously at goal. Okay to proceed with hip replacement and is low risk.

## 2020-01-29 NOTE — Progress Notes (Signed)
   Subjective:   Patient ID: Joshua [name], male    DOB: [birth year], 64 y.o.   MRN: [id number]  HPI The patient is a 64 YO man coming in for pre-op clearance for hip replacement. Denies chest pains. Overall feels well except the hip pain. BP has been running slightly high due to lack of exercise and pain from the hip. He did get a cortisone injection in the hip about 3-4 days ago and this is working well for pain. He plans to have surgery in March.   Review of Systems  Constitutional: Negative.   HENT: Negative.   Eyes: Negative.   Respiratory: Negative for cough, chest tightness and shortness of breath.   Cardiovascular: Negative for chest pain, palpitations and leg swelling.  Gastrointestinal: Negative for abdominal distention, abdominal pain, constipation, diarrhea, nausea and vomiting.  Musculoskeletal: Positive for arthralgias.  Skin: Negative.   Neurological: Negative.   Psychiatric/Behavioral: Negative.     Objective:  Physical Exam Constitutional:      Appearance: He is well-developed.  HENT:     Head: Normocephalic and atraumatic.  Cardiovascular:     Rate and Rhythm: Normal rate and regular rhythm.  Pulmonary:     Effort: Pulmonary effort is normal. No respiratory distress.     Breath sounds: Normal breath sounds. No wheezing or rales.  Abdominal:     General: Bowel sounds are normal. There is no distension.     Palpations: Abdomen is soft.     Tenderness: There is no abdominal tenderness. There is no rebound.  Musculoskeletal:     Cervical back: Normal range of motion.  Skin:    General: Skin is warm and dry.  Neurological:     Mental Status: He is alert and oriented to person, place, and time.     Coordination: Coordination normal.     Vitals:   01/29/20 0949  BP: (!) 168/90  Pulse: 72  Temp: 98.3 F (36.8 C)  TempSrc: Oral  SpO2: 97%  Weight: 192 lb (87.1 kg)   EKG: Rate 73, axis normal, intervals normal, sinus, rare PVC, no st or t wave changes,  no significant change when compared to 2020  This visit occurred during the SARS-CoV-2 public health emergency.  Safety protocols were in place, including screening questions prior to the visit, additional usage of staff PPE, and extensive cleaning of exam room while observing appropriate contact time as indicated for disinfecting solutions.   Assessment & Plan:

## 2020-05-03 DIAGNOSIS — Z01818 Encounter for other preprocedural examination: Secondary | ICD-10-CM | POA: Diagnosis not present

## 2020-05-12 DIAGNOSIS — Z87891 Personal history of nicotine dependence: Secondary | ICD-10-CM | POA: Diagnosis not present

## 2020-05-12 DIAGNOSIS — F32A Depression, unspecified: Secondary | ICD-10-CM | POA: Diagnosis not present

## 2020-05-12 DIAGNOSIS — M1611 Unilateral primary osteoarthritis, right hip: Secondary | ICD-10-CM | POA: Diagnosis not present

## 2020-05-12 DIAGNOSIS — M879 Osteonecrosis, unspecified: Secondary | ICD-10-CM | POA: Diagnosis not present

## 2020-05-12 DIAGNOSIS — E785 Hyperlipidemia, unspecified: Secondary | ICD-10-CM | POA: Diagnosis not present

## 2020-05-12 DIAGNOSIS — E039 Hypothyroidism, unspecified: Secondary | ICD-10-CM | POA: Diagnosis not present

## 2020-05-12 DIAGNOSIS — Z20822 Contact with and (suspected) exposure to covid-19: Secondary | ICD-10-CM | POA: Diagnosis not present

## 2020-05-12 DIAGNOSIS — F419 Anxiety disorder, unspecified: Secondary | ICD-10-CM | POA: Diagnosis not present

## 2020-05-13 DIAGNOSIS — E785 Hyperlipidemia, unspecified: Secondary | ICD-10-CM | POA: Diagnosis not present

## 2020-05-13 DIAGNOSIS — Z87891 Personal history of nicotine dependence: Secondary | ICD-10-CM | POA: Diagnosis not present

## 2020-05-13 DIAGNOSIS — R531 Weakness: Secondary | ICD-10-CM | POA: Diagnosis not present

## 2020-05-13 DIAGNOSIS — M1611 Unilateral primary osteoarthritis, right hip: Secondary | ICD-10-CM | POA: Diagnosis not present

## 2020-05-13 DIAGNOSIS — Z96641 Presence of right artificial hip joint: Secondary | ICD-10-CM | POA: Diagnosis not present

## 2020-05-13 DIAGNOSIS — E039 Hypothyroidism, unspecified: Secondary | ICD-10-CM | POA: Diagnosis not present

## 2020-05-13 DIAGNOSIS — Z471 Aftercare following joint replacement surgery: Secondary | ICD-10-CM | POA: Diagnosis not present

## 2020-05-13 DIAGNOSIS — Z20822 Contact with and (suspected) exposure to covid-19: Secondary | ICD-10-CM | POA: Diagnosis not present

## 2020-05-13 DIAGNOSIS — F419 Anxiety disorder, unspecified: Secondary | ICD-10-CM | POA: Diagnosis not present

## 2020-05-13 DIAGNOSIS — F32A Depression, unspecified: Secondary | ICD-10-CM | POA: Diagnosis not present

## 2020-05-22 ENCOUNTER — Other Ambulatory Visit: Payer: Self-pay | Admitting: Internal Medicine

## 2020-05-23 DIAGNOSIS — Z96641 Presence of right artificial hip joint: Secondary | ICD-10-CM | POA: Diagnosis not present

## 2020-05-23 DIAGNOSIS — Z471 Aftercare following joint replacement surgery: Secondary | ICD-10-CM | POA: Diagnosis not present

## 2020-05-23 DIAGNOSIS — R262 Difficulty in walking, not elsewhere classified: Secondary | ICD-10-CM | POA: Diagnosis not present

## 2020-05-23 DIAGNOSIS — M25551 Pain in right hip: Secondary | ICD-10-CM | POA: Diagnosis not present

## 2020-05-31 DIAGNOSIS — M25551 Pain in right hip: Secondary | ICD-10-CM | POA: Diagnosis not present

## 2020-05-31 DIAGNOSIS — Z471 Aftercare following joint replacement surgery: Secondary | ICD-10-CM | POA: Diagnosis not present

## 2020-05-31 DIAGNOSIS — R262 Difficulty in walking, not elsewhere classified: Secondary | ICD-10-CM | POA: Diagnosis not present

## 2020-05-31 DIAGNOSIS — Z96641 Presence of right artificial hip joint: Secondary | ICD-10-CM | POA: Diagnosis not present

## 2020-06-07 DIAGNOSIS — Z471 Aftercare following joint replacement surgery: Secondary | ICD-10-CM | POA: Diagnosis not present

## 2020-06-07 DIAGNOSIS — Z96641 Presence of right artificial hip joint: Secondary | ICD-10-CM | POA: Diagnosis not present

## 2020-06-07 DIAGNOSIS — M25551 Pain in right hip: Secondary | ICD-10-CM | POA: Diagnosis not present

## 2020-06-07 DIAGNOSIS — R262 Difficulty in walking, not elsewhere classified: Secondary | ICD-10-CM | POA: Diagnosis not present

## 2020-06-20 DIAGNOSIS — Z471 Aftercare following joint replacement surgery: Secondary | ICD-10-CM | POA: Diagnosis not present

## 2020-06-20 DIAGNOSIS — M25551 Pain in right hip: Secondary | ICD-10-CM | POA: Diagnosis not present

## 2020-06-20 DIAGNOSIS — Z96641 Presence of right artificial hip joint: Secondary | ICD-10-CM | POA: Diagnosis not present

## 2020-06-20 DIAGNOSIS — R262 Difficulty in walking, not elsewhere classified: Secondary | ICD-10-CM | POA: Diagnosis not present

## 2020-06-23 DIAGNOSIS — Z91018 Allergy to other foods: Secondary | ICD-10-CM | POA: Diagnosis not present

## 2020-06-23 DIAGNOSIS — T783XXD Angioneurotic edema, subsequent encounter: Secondary | ICD-10-CM | POA: Diagnosis not present

## 2020-06-23 DIAGNOSIS — T7840XD Allergy, unspecified, subsequent encounter: Secondary | ICD-10-CM | POA: Diagnosis not present

## 2020-06-23 DIAGNOSIS — Z0182 Encounter for allergy testing: Secondary | ICD-10-CM | POA: Diagnosis not present

## 2020-06-28 DIAGNOSIS — R262 Difficulty in walking, not elsewhere classified: Secondary | ICD-10-CM | POA: Diagnosis not present

## 2020-06-28 DIAGNOSIS — Z96641 Presence of right artificial hip joint: Secondary | ICD-10-CM | POA: Diagnosis not present

## 2020-06-28 DIAGNOSIS — M25551 Pain in right hip: Secondary | ICD-10-CM | POA: Diagnosis not present

## 2020-06-28 DIAGNOSIS — Z471 Aftercare following joint replacement surgery: Secondary | ICD-10-CM | POA: Diagnosis not present

## 2020-07-07 DIAGNOSIS — M159 Polyosteoarthritis, unspecified: Secondary | ICD-10-CM | POA: Diagnosis not present

## 2020-07-07 DIAGNOSIS — E785 Hyperlipidemia, unspecified: Secondary | ICD-10-CM | POA: Diagnosis not present

## 2020-07-07 DIAGNOSIS — Z0000 Encounter for general adult medical examination without abnormal findings: Secondary | ICD-10-CM | POA: Diagnosis not present

## 2020-07-07 DIAGNOSIS — E034 Atrophy of thyroid (acquired): Secondary | ICD-10-CM | POA: Diagnosis not present

## 2020-07-07 DIAGNOSIS — T783XXS Angioneurotic edema, sequela: Secondary | ICD-10-CM | POA: Diagnosis not present

## 2020-10-10 DIAGNOSIS — Z13220 Encounter for screening for lipoid disorders: Secondary | ICD-10-CM | POA: Diagnosis not present

## 2020-10-10 DIAGNOSIS — Z131 Encounter for screening for diabetes mellitus: Secondary | ICD-10-CM | POA: Diagnosis not present

## 2020-10-10 DIAGNOSIS — Z0000 Encounter for general adult medical examination without abnormal findings: Secondary | ICD-10-CM | POA: Diagnosis not present

## 2020-10-11 DIAGNOSIS — Z1159 Encounter for screening for other viral diseases: Secondary | ICD-10-CM | POA: Diagnosis not present

## 2020-10-11 DIAGNOSIS — I10 Essential (primary) hypertension: Secondary | ICD-10-CM | POA: Diagnosis not present

## 2020-10-11 DIAGNOSIS — Z1211 Encounter for screening for malignant neoplasm of colon: Secondary | ICD-10-CM | POA: Diagnosis not present

## 2020-10-11 DIAGNOSIS — E034 Atrophy of thyroid (acquired): Secondary | ICD-10-CM | POA: Diagnosis not present

## 2020-10-13 ENCOUNTER — Other Ambulatory Visit: Payer: Self-pay | Admitting: Internal Medicine

## 2021-01-11 DIAGNOSIS — Z1159 Encounter for screening for other viral diseases: Secondary | ICD-10-CM | POA: Diagnosis not present

## 2021-01-11 DIAGNOSIS — Z0000 Encounter for general adult medical examination without abnormal findings: Secondary | ICD-10-CM | POA: Diagnosis not present

## 2021-01-11 DIAGNOSIS — Z13220 Encounter for screening for lipoid disorders: Secondary | ICD-10-CM | POA: Diagnosis not present

## 2021-01-11 DIAGNOSIS — I10 Essential (primary) hypertension: Secondary | ICD-10-CM | POA: Diagnosis not present

## 2021-01-13 DIAGNOSIS — Z0000 Encounter for general adult medical examination without abnormal findings: Secondary | ICD-10-CM | POA: Diagnosis not present

## 2021-01-13 DIAGNOSIS — I10 Essential (primary) hypertension: Secondary | ICD-10-CM | POA: Diagnosis not present

## 2021-01-13 DIAGNOSIS — F411 Generalized anxiety disorder: Secondary | ICD-10-CM | POA: Diagnosis not present

## 2021-01-13 DIAGNOSIS — E034 Atrophy of thyroid (acquired): Secondary | ICD-10-CM | POA: Diagnosis not present

## 2021-02-07 DIAGNOSIS — C44319 Basal cell carcinoma of skin of other parts of face: Secondary | ICD-10-CM | POA: Diagnosis not present

## 2021-02-07 DIAGNOSIS — D2339 Other benign neoplasm of skin of other parts of face: Secondary | ICD-10-CM | POA: Diagnosis not present

## 2021-04-29 LAB — EXTERNAL GENERIC LAB PROCEDURE: COLOGUARD: NEGATIVE

## 2022-12-17 ENCOUNTER — Telehealth: Payer: Self-pay | Admitting: Internal Medicine

## 2022-12-17 NOTE — Telephone Encounter (Signed)
Attempted phone call to pt.  OK per Epic to leave voicemail message.  Pt advised Dr Graciela Husbands is not familiar with cardiologists in the [place] area.  Encouraged pt to contact his local PCP for further recommendations or pt may research best cardiologist in his area. Advised to please contact office for any further questions or concerns.

## 2022-12-17 NOTE — Telephone Encounter (Signed)
Calling to see if if Dr. Graciela Husbands can put a referral for him to see a cardiologist in [place]. Please advise
